# Patient Record
Sex: Female | Born: 1990 | Race: Black or African American | Hispanic: No | Marital: Married | Smoking: Never smoker
Health system: Southern US, Community
[De-identification: ages and names within clinical notes are randomized; demographics above are authoritative.]

## PROBLEM LIST (undated history)

## (undated) DIAGNOSIS — Z789 Other specified health status: Secondary | ICD-10-CM

## (undated) DIAGNOSIS — I10 Essential (primary) hypertension: Secondary | ICD-10-CM

## (undated) HISTORY — PX: APPENDECTOMY: SHX54

## (undated) HISTORY — DX: Essential (primary) hypertension: I10

---

## 2015-05-11 NOTE — L&D Delivery Note (Signed)
25 y/o G1P1 s/p Lap appy presented with SOL.  Delivery Note At  a viable female was delivered via  (Presentation: direct OP  ).  APGAR:8 ,9 ;  Placenta status: ntact with 3 vessels  Anesthesia:  Nitrous and IV fentyl Episiotomy:   none Lacerations:   none Est. Blood Loss (mL):   100  Mom to postpartum.  Baby to Couplet care / Skin to Skin.  Ernestina Pennaicholas Schenk 01/05/2016, 7:39 PM

## 2015-10-21 ENCOUNTER — Encounter: Payer: Self-pay | Admitting: *Deleted

## 2015-11-10 ENCOUNTER — Encounter: Payer: Self-pay | Admitting: Obstetrics & Gynecology

## 2015-11-10 ENCOUNTER — Other Ambulatory Visit (HOSPITAL_COMMUNITY)
Admission: RE | Admit: 2015-11-10 | Discharge: 2015-11-10 | Disposition: A | Discharge status: Home / Self Care | Payer: Medicaid Other | Source: Ambulatory Visit | Attending: Obstetrics & Gynecology | Admitting: Obstetrics & Gynecology

## 2015-11-10 ENCOUNTER — Ambulatory Visit (INDEPENDENT_AMBULATORY_CARE_PROVIDER_SITE_OTHER): Payer: Medicaid Other | Admitting: Obstetrics & Gynecology

## 2015-11-10 VITALS — BP 112/81 | HR 101 | Temp 98.2°F | Ht 61.5 in | Wt 158.8 lb

## 2015-11-10 DIAGNOSIS — Z01411 Encounter for gynecological examination (general) (routine) with abnormal findings: Secondary | ICD-10-CM | POA: Diagnosis present

## 2015-11-10 DIAGNOSIS — Z3400 Encounter for supervision of normal first pregnancy, unspecified trimester: Secondary | ICD-10-CM | POA: Insufficient documentation

## 2015-11-10 DIAGNOSIS — Z113 Encounter for screening for infections with a predominantly sexual mode of transmission: Secondary | ICD-10-CM | POA: Diagnosis present

## 2015-11-10 DIAGNOSIS — Z3490 Encounter for supervision of normal pregnancy, unspecified, unspecified trimester: Secondary | ICD-10-CM

## 2015-11-10 DIAGNOSIS — Z3403 Encounter for supervision of normal first pregnancy, third trimester: Secondary | ICD-10-CM

## 2015-11-10 DIAGNOSIS — Z3493 Encounter for supervision of normal pregnancy, unspecified, third trimester: Secondary | ICD-10-CM

## 2015-11-10 DIAGNOSIS — O0933 Supervision of pregnancy with insufficient antenatal care, third trimester: Secondary | ICD-10-CM | POA: Insufficient documentation

## 2015-11-10 LAB — POCT URINALYSIS DIP (DEVICE)
Bilirubin Urine: NEGATIVE
Glucose, UA: NEGATIVE mg/dL
HGB URINE DIPSTICK: NEGATIVE
Ketones, ur: NEGATIVE mg/dL
Nitrite: NEGATIVE
PH: 7 (ref 5.0–8.0)
Protein, ur: NEGATIVE mg/dL
SPECIFIC GRAVITY, URINE: 1.02 (ref 1.005–1.030)
UROBILINOGEN UA: 0.2 mg/dL (ref 0.0–1.0)

## 2015-11-10 MED ORDER — PANTOPRAZOLE SODIUM 40 MG PO TBEC: 40.0000 mg | DELAYED_RELEASE_TABLET | Freq: Every day | ORAL | Status: DC

## 2015-11-10 NOTE — Progress Notes (Signed)
Here for initial prenatal visit.  Transfer[place] care [facility]. Wants to wait on tdap.

## 2015-11-10 NOTE — Progress Notes (Signed)
  Subjective:    Kimberly Farley is a G1P0 5212w0d being seen today for her first obstetrical visit.  Her obstetrical history is significant for No prenatal care prior to 26 weeks, occasional alcohol use during pregnancy. Patient does intend to breast feed. Pregnancy history fully reviewed.  Patient reports heartburn, no bleeding, no contractions, no cramping and no leaking. No headaches, edema, changes in vision or RUQ pain since pregnancy began. Patient denies N/V/D and constipation throughout the pregnancy thus far.  [facility] at 26 weeks after taking a home pregnancy test requested by her mother. Mom is support person, FOB is minimally involved. Interested in IUD post-delivery. Maternal aunt has T2DM, mom has HTN. No personal history of either in the patient.   Dr. Tawni Levyorn's office provided anatomy scan and performed glucola test. Patient was instructed to do a second glucola test in this office by oversight. Patient acknowledged understanding.  Filed Vitals:   11/10/15 1455 11/10/15 1458 11/10/15 1505  BP: 113/86  112/81  Pulse: 101    Temp: 98.2 F (36.8 C)    Height:  5' 1.5" (1.562 m)   Weight: 158 lb 12.8 oz (72.031 kg)      HISTORY: OB History  Gravida Para Term Preterm AB SAB TAB Ectopic Multiple Living  1             # Outcome Date GA Lbr Len/2nd Weight Sex Delivery Anes PTL Lv  1 Current              History reviewed. No pertinent past medical history. History reviewed. No pertinent past surgical history. Family History  Problem Relation Age of Onset  . Hypertension Mother      Exam    Uterus: Appropriate for gestational age  Pelvic Exam:    Perineum: Normal external genitalia    Vulva: Normal external genitalia    Vagina:  Normal external genitalia    pH: N/A   Cervix: no bleeding following Pap, no cervical motion tenderness and no lesions   Adnexa: normal adnexa      System: Breast:  normal appearance, no masses or tenderness, No nipple  discharge or bleeding, No axillary or supraclavicular adenopathy   Skin: normal coloration and turgor, no rashes    Neurologic: AAOx3   Extremities: Full ROM, Strength 5/5                   Respiratory:  Normal WOB, no accessory muscle use   Abdomen: soft, non-tender; bowel sounds normal; no masses,  no organomegaly and gravid; appropriate for GA   Urinary: urethral meatus normal      Assessment:    Pregnancy: G1P0 Patient Active Problem List   Diagnosis Date Noted  . Supervision of normal first pregnancy 11/10/2015        Plan:    Supervision of normal first pregnancy Labs not provided by [facility] drawn. Pap smear, pelvic exam Encouraged patient to continue taking prenatal vitamins. Problem list reviewed and updated.  Instructed patient on how to cope with the growing pains of a fetus. Pre-term labor precautions provided  Follow up in 2 weeks. 50% of 30 min visit spent on counseling and coordination of care  Mariana SingleJamie Neira Bentsen 11/10/2015

## 2015-11-10 NOTE — Addendum Note (Signed)
Addended by: Adam PhenixARNOLD, JAMES G on: 11/10/2015 05:22 PM   Modules accepted: Orders

## 2015-11-10 NOTE — Patient Instructions (Signed)

## 2015-11-12 LAB — GC/CHLAMYDIA PROBE AMP (~~LOC~~) NOT AT ARMC
Chlamydia: NEGATIVE
NEISSERIA GONORRHEA: NEGATIVE

## 2015-11-12 LAB — CYTOLOGY - PAP

## 2015-11-19 ENCOUNTER — Ambulatory Visit (HOSPITAL_COMMUNITY)
Admission: RE | Admit: 2015-11-19 | Discharge: 2015-11-19 | Disposition: A | Discharge status: Home / Self Care | Payer: Medicaid Other | Source: Ambulatory Visit | Attending: Obstetrics & Gynecology | Admitting: Obstetrics & Gynecology

## 2015-11-19 ENCOUNTER — Other Ambulatory Visit: Payer: Self-pay | Admitting: Obstetrics & Gynecology

## 2015-11-19 DIAGNOSIS — O0933 Supervision of pregnancy with insufficient antenatal care, third trimester: Secondary | ICD-10-CM

## 2015-11-19 DIAGNOSIS — Z3490 Encounter for supervision of normal pregnancy, unspecified, unspecified trimester: Secondary | ICD-10-CM

## 2015-11-19 DIAGNOSIS — Z3689 Encounter for other specified antenatal screening: Secondary | ICD-10-CM

## 2015-11-19 DIAGNOSIS — Z3493 Encounter for supervision of normal pregnancy, unspecified, third trimester: Secondary | ICD-10-CM

## 2015-11-19 DIAGNOSIS — Z3A30 30 weeks gestation of pregnancy: Secondary | ICD-10-CM

## 2015-11-19 DIAGNOSIS — Z3403 Encounter for supervision of normal first pregnancy, third trimester: Secondary | ICD-10-CM

## 2015-11-19 DIAGNOSIS — Z36 Encounter for antenatal screening of mother: Secondary | ICD-10-CM | POA: Insufficient documentation

## 2015-11-20 ENCOUNTER — Other Ambulatory Visit (HOSPITAL_COMMUNITY): Payer: Self-pay | Admitting: *Deleted

## 2015-11-20 DIAGNOSIS — O283 Abnormal ultrasonic finding on antenatal screening of mother: Secondary | ICD-10-CM

## 2015-11-20 LAB — PARVOVIRUS B19 ANTIBODY, IGG AND IGM
Parovirus B19 IgG Abs: 6.3 index — ABNORMAL HIGH (ref 0.0–0.8)
Parovirus B19 IgM Abs: 0.2 index (ref 0.0–0.8)

## 2015-11-20 LAB — CMV IGM: CMV IgM: <30 AU/mL (ref 0.0–29.9)

## 2015-11-20 LAB — TOXOPLASMA ANTIBODIES- IGG AND  IGM: Toxoplasma Antibody- IgM: 3.2 AU/mL (ref 0.0–7.9)

## 2015-11-20 LAB — CMV ANTIBODY, IGG (EIA)

## 2015-11-21 ENCOUNTER — Telehealth (HOSPITAL_COMMUNITY): Payer: Self-pay | Admitting: *Deleted

## 2015-11-21 NOTE — Telephone Encounter (Signed)
TC to patient, name and DOB verified.  Pt notified of neg viral studies, waiting on Panorama results.  Pt voiced understanding, no questions.

## 2015-11-27 ENCOUNTER — Other Ambulatory Visit (HOSPITAL_COMMUNITY): Payer: Self-pay

## 2015-11-27 ENCOUNTER — Telehealth (HOSPITAL_COMMUNITY): Payer: Self-pay | Admitting: MS"

## 2015-11-27 NOTE — Telephone Encounter (Signed)
Called Smitty KnudsenCourtney Gaddy to discuss her prenatal cell free DNA test results.  Ms. Smitty KnudsenCourtney Gaddy had Panorama testing through [facility].  Testing was offered because of ultrasound findings.   The patient was identified by name and DOB.  We reviewed that these are within normal limits, showing a less than 1 in 10,000 risk for trisomies 21, 18 and 13, and monosomy X (Turner syndrome).  In addition, the risk for triploidy/vanishing twin and sex chromosome trisomies (47,XXX and 47,XXY) was also low risk.  We reviewed that this testing identifies > 99% of pregnancies with trisomy 2221, trisomy 1313, sex chromosome trisomies (47,XXX and 47,XXY), and triploidy. The detection rate for trisomy 18 is 96%.  The detection rate for monosomy X is ~92%.  The false positive rate is <0.1% for all conditions. Testing was also consistent with female fetal sex.  She understands that this testing does not identify all genetic conditions.  All questions were answered to her satisfaction, she was encouraged to call with additional questions or concerns.  Quinn PlowmanKaren Darrah Dredge, MS Certified Genetic Counselor 11/27/2015 9:20 AM

## 2015-12-01 ENCOUNTER — Ambulatory Visit (INDEPENDENT_AMBULATORY_CARE_PROVIDER_SITE_OTHER): Payer: Medicaid Other | Admitting: Advanced Practice Midwife

## 2015-12-01 VITALS — BP 119/75 | HR 79 | Wt 161.7 lb

## 2015-12-01 DIAGNOSIS — E319 Polyglandular dysfunction, unspecified: Secondary | ICD-10-CM

## 2015-12-01 DIAGNOSIS — Z3403 Encounter for supervision of normal first pregnancy, third trimester: Secondary | ICD-10-CM

## 2015-12-01 DIAGNOSIS — R319 Hematuria, unspecified: Secondary | ICD-10-CM

## 2015-12-01 LAB — POCT URINALYSIS DIP (DEVICE)
Bilirubin Urine: NEGATIVE
GLUCOSE, UA: NEGATIVE mg/dL
Ketones, ur: NEGATIVE mg/dL
LEUKOCYTES UA: NEGATIVE
NITRITE: NEGATIVE
Protein, ur: NEGATIVE mg/dL
SPECIFIC GRAVITY, URINE: 1.02 (ref 1.005–1.030)
UROBILINOGEN UA: 0.2 mg/dL (ref 0.0–1.0)
pH: 7 (ref 5.0–8.0)

## 2015-12-01 NOTE — Progress Notes (Signed)
Urinalysis shows moderate hemoglobin. Denies burning or pain with urination.

## 2015-12-01 NOTE — Progress Notes (Signed)
Subjective:  Kimberly Farley is a 25 y.o. G1P0 at [redacted]w[redacted]d being seen today for ongoing prenatal care.  She is currently monitored for the following issues for this low-risk pregnancy and has Supervision of normal first pregnancy and Late prenatal care affecting pregnancy in third trimester, antepartum on her problem list.  Patient reports no complaints.  Contractions: Irregular. Vag. Bleeding: None.  Movement: Present. Denies leaking of fluid.   The following portions of the patient's history were reviewed and updated as appropriate: allergies, current medications, past family history, past medical history, past social history, past surgical history and problem list. Problem list updated.  Objective:   Vitals:   12/01/15 1446  BP: 119/75  Pulse: 79  Weight: 161 lb 11.2 oz (73.3 kg)    Fetal Status: Fetal Heart Rate (bpm): 156 Fundal Height: 32 cm Movement: Present     General:  Alert, oriented and cooperative. Patient is in no acute distress.  Skin: Skin is warm and dry. No rash noted.   Cardiovascular: Normal heart rate noted  Respiratory: Normal respiratory effort, no problems with respiration noted  Abdomen: Soft, gravid, appropriate for gestational age. Pain/Pressure: Absent     Pelvic:  Cervical exam deferred        Extremities: Normal range of motion.  Edema: Trace  Mental Status: Normal mood and affect. Normal behavior. Normal judgment and thought content.   Urinalysis: Urine Protein: Negative Urine Glucose: Negative  Assessment and Plan:  Pregnancy: G1P0 at [redacted]w[redacted]d  1. Hematuria  - Culture, OB Urine  2. Encounter for supervision of normal first pregnancy in third trimester - Plans TDaP at [place]. Handout given.    Preterm labor symptoms and general obstetric precautions including but not limited to vaginal bleeding, contractions, leaking of fluid and fetal movement were reviewed in detail with the patient. Please refer to After Visit Summary for other counseling  recommendations.  Return in about 2 weeks (around 12/15/2015) for ROB.   Dorathy Kinsman, CNM

## 2015-12-01 NOTE — Patient Instructions (Addendum)
Braxton Hicks Contractions Contractions of the uterus can occur throughout pregnancy. Contractions are not always a sign that you are in labor.  WHAT ARE BRAXTON HICKS CONTRACTIONS?  Contractions that occur before labor are called Braxton Hicks contractions, or false labor. Toward the end of pregnancy (32-34 weeks), these contractions can develop more often and may become more forceful. This is not true labor because these contractions do not result in opening (dilatation) and thinning of the cervix. They are sometimes difficult to tell apart from true labor because these contractions can be forceful and people have different pain tolerances. You should not feel embarrassed if you go to the hospital with false labor. Sometimes, the only way to tell if you are in true labor is for your health care provider to look for changes in the cervix. If there are no prenatal problems or other health problems associated with the pregnancy, it is completely safe to be sent home with false labor and await the onset of true labor. HOW CAN YOU TELL THE DIFFERENCE BETWEEN TRUE AND FALSE LABOR? False Labor  The contractions of false labor are usually shorter and not as hard as those of true labor.   The contractions are usually irregular.   The contractions are often felt in the front of the lower abdomen and in the groin.   The contractions may go away when you walk around or change positions while lying down.   The contractions get weaker and are shorter lasting as time goes on.   The contractions do not usually become progressively stronger, regular, and closer together as with true labor.  True Labor  Contractions in true labor last 30-70 seconds, become very regular, usually become more intense, and increase in frequency.   The contractions do not go away with walking.   The discomfort is usually felt in the top of the uterus and spreads to the lower abdomen and low back.   True labor can be  determined by your health care provider with an exam. This will show that the cervix is dilating and getting thinner.  WHAT TO REMEMBER  Keep up with your usual exercises and follow other instructions given by your health care provider.   Take medicines as directed by your health care provider.   Keep your regular prenatal appointments.   Eat and drink lightly if you think you are going into labor.   If Braxton Hicks contractions are making you uncomfortable:   Change your position from lying down or resting to walking, or from walking to resting.   Sit and rest in a tub of warm water.   Drink 2-3 glasses of water. Dehydration may cause these contractions.   Do slow and deep breathing several times an hour.  WHEN SHOULD I SEEK IMMEDIATE MEDICAL CARE? Seek immediate medical care if:  Your contractions become stronger, more regular, and closer together.   You have fluid leaking or gushing from your vagina.   You have a fever.   You pass blood-tinged mucus.   You have vaginal bleeding.   You have continuous abdominal pain.   You have low back pain that you never had before.   You feel your baby's head pushing down and causing pelvic pressure.   Your baby is not moving as much as it used to.    This information is not intended to replace advice given to you by your health care provider. Make sure you discuss any questions you have with your health care   provider.   Document Released: 04/26/2005 Document Revised: 05/01/2013 Document Reviewed: 02/05/2013 Elsevier Interactive Patient Education 2016 Elsevier Inc.  Tdap Vaccine (Tetanus, Diphtheria and Pertussis): What You Need to Know 1. Why get vaccinated? Tetanus, diphtheria and pertussis are very serious diseases. Tdap vaccine can protect us from these diseases. And, Tdap vaccine given to pregnant women can protect newborn babies against pertussis. TETANUS (Lockjaw) is rare in the [place] today. It  causes painful muscle tightening and stiffness, usually all over the body.  It can lead to tightening of muscles in the head and neck so you can't open your mouth, swallow, or sometimes even breathe. Tetanus kills about 1 out of 10 people who are infected even after receiving the best medical care. DIPHTHERIA is also rare in the [place] today. It can cause a thick coating to form in the back of the throat.  It can lead to breathing problems, heart failure, paralysis, and death. PERTUSSIS (Whooping Cough) causes severe coughing spells, which can cause difficulty breathing, vomiting and disturbed sleep.  It can also lead to weight loss, incontinence, and rib fractures. Up to 2 in 100 adolescents and 5 in 100 adults with pertussis are hospitalized or have complications, which could include pneumonia or death. These diseases are caused by bacteria. Diphtheria and pertussis are spread from person to person through secretions from coughing or sneezing. Tetanus enters the body through cuts, scratches, or wounds. Before vaccines, as many as 200,000 cases of diphtheria, 200,000 cases of pertussis, and hundreds of cases of tetanus, were reported in the [place] each year. Since vaccination began, reports of cases for tetanus and diphtheria have dropped by about 99% and for pertussis by about 80%. 2. Tdap vaccine Tdap vaccine can protect adolescents and adults from tetanus, diphtheria, and pertussis. One dose of Tdap is routinely given at age 11 or 12. People who did not get Tdap at that age should get it as soon as possible. Tdap is especially important for healthcare professionals and anyone having close contact with a baby younger than 12 months. Pregnant women should get a dose of Tdap during every pregnancy, to protect the newborn from pertussis. Infants are most at risk for severe, life-threatening complications from pertussis. Another vaccine, called Td, protects against tetanus and diphtheria,  but not pertussis. A Td booster should be given every 10 years. Tdap may be given as one of these boosters if you have never gotten Tdap before. Tdap may also be given after a severe cut or burn to prevent tetanus infection. Your doctor or the person giving you the vaccine can give you more information. Tdap may safely be given at the same time as other vaccines. 3. Some people should not get this vaccine  A person who has ever had a life-threatening allergic reaction after a previous dose of any diphtheria, tetanus or pertussis containing vaccine, OR has a severe allergy to any part of this vaccine, should not get Tdap vaccine. Tell the person giving the vaccine about any severe allergies.  Anyone who had coma or long repeated seizures within 7 days after a childhood dose of DTP or DTaP, or a previous dose of Tdap, should not get Tdap, unless a cause other than the vaccine was found. They can still get Td.  Talk to your doctor if you:  have seizures or another nervous system problem,  had severe pain or swelling after any vaccine containing diphtheria, tetanus or pertussis,  ever had a condition called Guillain-Barr Syndrome (  GBS),  aren't feeling well on the day the shot is scheduled. 4. Risks With any medicine, including vaccines, there is a chance of side effects. These are usually mild and go away on their own. Serious reactions are also possible but are rare. Most people who get Tdap vaccine do not have any problems with it. Mild problems following Tdap (Did not interfere with activities)  Pain where the shot was given (about 3 in 4 adolescents or 2 in 3 adults)  Redness or swelling where the shot was given (about 1 person in 5)  Mild fever of at least 100.4F (up to about 1 in 25 adolescents or 1 in 100 adults)  Headache (about 3 or 4 people in 10)  Tiredness (about 1 person in 3 or 4)  Nausea, vomiting, diarrhea, stomach ache (up to 1 in 4 adolescents or 1 in 10  adults)  Chills, sore joints (about 1 person in 10)  Body aches (about 1 person in 3 or 4)  Rash, swollen glands (uncommon) Moderate problems following Tdap (Interfered with activities, but did not require medical attention)  Pain where the shot was given (up to 1 in 5 or 6)  Redness or swelling where the shot was given (up to about 1 in 16 adolescents or 1 in 12 adults)  Fever over 102F (about 1 in 100 adolescents or 1 in 250 adults)  Headache (about 1 in 7 adolescents or 1 in 10 adults)  Nausea, vomiting, diarrhea, stomach ache (up to 1 or 3 people in 100)  Swelling of the entire arm where the shot was given (up to about 1 in 500). Severe problems following Tdap (Unable to perform usual activities; required medical attention)  Swelling, severe pain, bleeding and redness in the arm where the shot was given (rare). Problems that could happen after any vaccine:  People sometimes faint after a medical procedure, including vaccination. Sitting or lying down for about 15 minutes can help prevent fainting, and injuries caused by a fall. Tell your doctor if you feel dizzy, or have vision changes or ringing in the ears.  Some people get severe pain in the shoulder and have difficulty moving the arm where a shot was given. This happens very rarely.  Any medication can cause a severe allergic reaction. Such reactions from a vaccine are very rare, estimated at fewer than 1 in a million doses, and would happen within a few minutes to a few hours after the vaccination. As with any medicine, there is a very remote chance of a vaccine causing a serious injury or death. The safety of vaccines is always being monitored. For more information, visit: www.cdc.gov/vaccinesafety/ 5. What if there is a serious problem? What should I look for?  Look for anything that concerns you, such as signs of a severe allergic reaction, very high fever, or unusual behavior.  Signs of a severe allergic reaction  can include hives, swelling of the face and throat, difficulty breathing, a fast heartbeat, dizziness, and weakness. These would usually start a few minutes to a few hours after the vaccination. What should I do?  If you think it is a severe allergic reaction or other emergency that can't wait, call 9-1-1 or get the person to the nearest hospital. Otherwise, call your doctor.  Afterward, the reaction should be reported to the Vaccine Adverse Event Reporting System (VAERS). Your doctor might file this report, or you can do it yourself through the VAERS web site at www.vaers.hhs.gov, or by calling   1-800-822-7967. VAERS does not give medical advice.  6. The National Vaccine Injury Compensation Program The National Vaccine Injury Compensation Program (VICP) is a federal program that was created to compensate people who may have been injured by certain vaccines. Persons who believe they may have been injured by a vaccine can learn about the program and about filing a claim by calling 1-800-338-2382 or visiting the VICP website at www.hrsa.gov/vaccinecompensation. There is a time limit to file a claim for compensation. 7. How can I learn more?  Ask your doctor. He or she can give you the vaccine package insert or suggest other sources of information.  Call your local or state health department.  Contact the Centers for Disease Control and Prevention (CDC):  Call 1-800-232-4636 (1-800-CDC-INFO) or  Visit CDC's website at www.cdc.gov/vaccines CDC Tdap Vaccine VIS (07/03/13)   This information is not intended to replace advice given to you by your health care provider. Make sure you discuss any questions you have with your health care provider.   Document Released: 10/26/2011 Document Revised: 05/17/2014 Document Reviewed: 08/08/2013 Elsevier Interactive Patient Education 2016 Elsevier Inc.   

## 2015-12-03 LAB — CULTURE, OB URINE

## 2015-12-11 ENCOUNTER — Ambulatory Visit (HOSPITAL_COMMUNITY)
Admission: RE | Admit: 2015-12-11 | Discharge: 2015-12-11 | Disposition: A | Discharge status: Home / Self Care | Payer: Medicaid Other | Source: Ambulatory Visit | Attending: Obstetrics & Gynecology | Admitting: Obstetrics & Gynecology

## 2015-12-11 ENCOUNTER — Encounter (HOSPITAL_COMMUNITY): Payer: Self-pay

## 2015-12-11 DIAGNOSIS — O0933 Supervision of pregnancy with insufficient antenatal care, third trimester: Secondary | ICD-10-CM | POA: Diagnosis not present

## 2015-12-11 DIAGNOSIS — O350XX0 Maternal care for (suspected) central nervous system malformation in fetus, not applicable or unspecified: Secondary | ICD-10-CM | POA: Insufficient documentation

## 2015-12-11 DIAGNOSIS — Z3A33 33 weeks gestation of pregnancy: Secondary | ICD-10-CM | POA: Diagnosis not present

## 2015-12-11 DIAGNOSIS — O283 Abnormal ultrasonic finding on antenatal screening of mother: Secondary | ICD-10-CM

## 2015-12-24 ENCOUNTER — Ambulatory Visit (INDEPENDENT_AMBULATORY_CARE_PROVIDER_SITE_OTHER): Payer: Medicaid Other | Admitting: Obstetrics and Gynecology

## 2015-12-24 ENCOUNTER — Other Ambulatory Visit (HOSPITAL_COMMUNITY)
Admission: RE | Admit: 2015-12-24 | Discharge: 2015-12-24 | Disposition: A | Discharge status: Home / Self Care | Payer: Medicaid Other | Source: Ambulatory Visit | Attending: Obstetrics and Gynecology | Admitting: Obstetrics and Gynecology

## 2015-12-24 VITALS — BP 123/72 | HR 118

## 2015-12-24 DIAGNOSIS — Z113 Encounter for screening for infections with a predominantly sexual mode of transmission: Secondary | ICD-10-CM | POA: Diagnosis not present

## 2015-12-24 DIAGNOSIS — Z3403 Encounter for supervision of normal first pregnancy, third trimester: Secondary | ICD-10-CM

## 2015-12-24 DIAGNOSIS — Z23 Encounter for immunization: Secondary | ICD-10-CM | POA: Diagnosis not present

## 2015-12-24 DIAGNOSIS — O0933 Supervision of pregnancy with insufficient antenatal care, third trimester: Secondary | ICD-10-CM

## 2015-12-24 LAB — OB RESULTS CONSOLE GC/CHLAMYDIA
CHLAMYDIA, DNA PROBE: NEGATIVE
Gonorrhea: NEGATIVE

## 2015-12-24 LAB — OB RESULTS CONSOLE GBS: GBS: NEGATIVE

## 2015-12-24 MED ORDER — TETANUS-DIPHTH-ACELL PERTUSSIS 5-2.5-18.5 LF-MCG/0.5 IM SUSP
0.5000 mL | Freq: Once | INTRAMUSCULAR | Status: AC
  Administered 2015-12-24: 0.5 mL via INTRAMUSCULAR

## 2015-12-24 NOTE — Progress Notes (Signed)
Subjective:  Smitty KnudsenCourtney Gaddy is a 25 y.o. G1P0 at 1748w2d being seen today for ongoing prenatal care.  She is currently monitored for the following issues for this low-risk pregnancy and has Supervision of normal first pregnancy and Late prenatal care affecting pregnancy in third trimester, antepartum on her problem list.  Patient reports fatigue.  Contractions: Not present.  .  Movement: Present. Denies leaking of fluid.   The following portions of the patient's history were reviewed and updated as appropriate: allergies, current medications, past family history, past medical history, past social history, past surgical history and problem list. Problem list updated.  Objective:   Vitals:   12/24/15 1305  BP: 123/72  Pulse: (!) 118    Fetal Status: Fetal Heart Rate (bpm): 133 Fundal Height: 36 cm Movement: Present     General:  Alert, oriented and cooperative. Patient is in no acute distress.  Skin: Skin is warm and dry. No rash noted.   Cardiovascular: Normal heart rate noted  Respiratory: Normal respiratory effort, no problems with respiration noted  Abdomen: Soft, gravid, appropriate for gestational age. Pain/Pressure: Absent     Pelvic:  Cervical exam deferred        Extremities: Normal range of motion.  Edema: Trace  Mental Status: Normal mood and affect. Normal behavior. Normal judgment and thought content.   Urinalysis:      Assessment and Plan:  Pregnancy: G1P0 at 8548w2d  1. Encounter for supervision of normal first pregnancy in third trimester - Culture, beta strep (group b only) - GC/Chlamydia probe amp ([place] -Tdap vaccine today  2. Late prenatal care affecting pregnancy in third trimester, antepartum 3. Contraceptive information on IUD provided to pt.   Preterm labor symptoms and general obstetric precautions including but not limited to vaginal bleeding, contractions, leaking of fluid and fetal movement were reviewed in detail with the  patient. Please refer to After Visit Summary for other counseling recommendations.  Return in about 1 week (around 12/31/2015) for OB visit.   Hermina StaggersMichael L Samil Mecham, MD

## 2015-12-25 LAB — GC/CHLAMYDIA PROBE AMP (~~LOC~~) NOT AT ARMC
Chlamydia: NEGATIVE
NEISSERIA GONORRHEA: NEGATIVE

## 2015-12-26 LAB — CULTURE, BETA STREP (GROUP B ONLY)

## 2015-12-30 ENCOUNTER — Inpatient Hospital Stay (HOSPITAL_COMMUNITY): Payer: Medicaid Other

## 2015-12-30 ENCOUNTER — Inpatient Hospital Stay (HOSPITAL_COMMUNITY)
Admission: AD | Admit: 2015-12-30 | Discharge: 2016-01-04 | DRG: 781 | Disposition: A | Discharge status: Home / Self Care | Payer: Medicaid Other | Source: Ambulatory Visit | Attending: Obstetrics and Gynecology | Admitting: Obstetrics and Gynecology

## 2015-12-30 ENCOUNTER — Encounter (HOSPITAL_COMMUNITY): Payer: Self-pay | Admitting: *Deleted

## 2015-12-30 DIAGNOSIS — K37 Unspecified appendicitis: Secondary | ICD-10-CM | POA: Diagnosis present

## 2015-12-30 DIAGNOSIS — Z3A36 36 weeks gestation of pregnancy: Secondary | ICD-10-CM

## 2015-12-30 DIAGNOSIS — O0990 Supervision of high risk pregnancy, unspecified, unspecified trimester: Secondary | ICD-10-CM

## 2015-12-30 DIAGNOSIS — R52 Pain, unspecified: Secondary | ICD-10-CM

## 2015-12-30 DIAGNOSIS — O99613 Diseases of the digestive system complicating pregnancy, third trimester: Principal | ICD-10-CM | POA: Diagnosis present

## 2015-12-30 DIAGNOSIS — Z8249 Family history of ischemic heart disease and other diseases of the circulatory system: Secondary | ICD-10-CM

## 2015-12-30 DIAGNOSIS — Z823 Family history of stroke: Secondary | ICD-10-CM

## 2015-12-30 DIAGNOSIS — K352 Acute appendicitis with generalized peritonitis: Secondary | ICD-10-CM | POA: Diagnosis present

## 2015-12-30 DIAGNOSIS — O0933 Supervision of pregnancy with insufficient antenatal care, third trimester: Secondary | ICD-10-CM

## 2015-12-30 DIAGNOSIS — K3580 Unspecified acute appendicitis: Secondary | ICD-10-CM

## 2015-12-30 DIAGNOSIS — O99619 Diseases of the digestive system complicating pregnancy, unspecified trimester: Secondary | ICD-10-CM

## 2015-12-30 DIAGNOSIS — K219 Gastro-esophageal reflux disease without esophagitis: Secondary | ICD-10-CM | POA: Diagnosis present

## 2015-12-30 HISTORY — DX: Other specified health status: Z78.9

## 2015-12-30 LAB — CBC WITH DIFFERENTIAL/PLATELET
BASOS ABS: 0 10*3/uL (ref 0.0–0.1)
Basophils Relative: 0 %
EOS ABS: 0 10*3/uL (ref 0.0–0.7)
Eosinophils Relative: 0 %
HEMATOCRIT: 37.2 % (ref 36.0–46.0)
HEMOGLOBIN: 13.3 g/dL (ref 12.0–15.0)
LYMPHS PCT: 4 %
Lymphs Abs: 0.7 10*3/uL (ref 0.7–4.0)
MCH: 31.2 pg (ref 26.0–34.0)
MCHC: 35.8 g/dL (ref 30.0–36.0)
MCV: 87.3 fL (ref 78.0–100.0)
MONOS PCT: 6 %
Monocytes Absolute: 1.1 10*3/uL — ABNORMAL HIGH (ref 0.1–1.0)
NEUTROS ABS: 15.9 10*3/uL — AB (ref 1.7–7.7)
NEUTROS PCT: 90 %
Platelets: 283 10*3/uL (ref 150–400)
RBC: 4.26 MIL/uL (ref 3.87–5.11)
RDW: 13 % (ref 11.5–15.5)
WBC: 17.7 10*3/uL — ABNORMAL HIGH (ref 4.0–10.5)

## 2015-12-30 LAB — COMPREHENSIVE METABOLIC PANEL
ALK PHOS: 116 U/L (ref 38–126)
ALT: 16 U/L (ref 14–54)
ANION GAP: 13 (ref 5–15)
AST: 20 U/L (ref 15–41)
Albumin: 2.9 g/dL — ABNORMAL LOW (ref 3.5–5.0)
BILIRUBIN TOTAL: 1.1 mg/dL (ref 0.3–1.2)
BUN: 6 mg/dL (ref 6–20)
CALCIUM: 9.8 mg/dL (ref 8.9–10.3)
CO2: 16 mmol/L — AB (ref 22–32)
Chloride: 105 mmol/L (ref 101–111)
Creatinine, Ser: 0.81 mg/dL (ref 0.44–1.00)
GFR calc non Af Amer: >60 mL/min (ref >60)
GLUCOSE: 103 mg/dL — AB (ref 65–99)
POTASSIUM: 3.5 mmol/L (ref 3.5–5.1)
Sodium: 134 mmol/L — ABNORMAL LOW (ref 135–145)
TOTAL PROTEIN: 7.5 g/dL (ref 6.5–8.1)

## 2015-12-30 LAB — URINALYSIS, ROUTINE W REFLEX MICROSCOPIC
Glucose, UA: NEGATIVE mg/dL
Ketones, ur: >80 mg/dL — AB
Leukocytes, UA: NEGATIVE
NITRITE: NEGATIVE
PROTEIN: 100 mg/dL — AB
Specific Gravity, Urine: >1.03 — ABNORMAL HIGH (ref 1.005–1.030)
pH: 6 (ref 5.0–8.0)

## 2015-12-30 LAB — CBC
HEMATOCRIT: 32.7 % — AB (ref 36.0–46.0)
HEMOGLOBIN: 11.8 g/dL — AB (ref 12.0–15.0)
MCH: 31.5 pg (ref 26.0–34.0)
MCHC: 36.1 g/dL — AB (ref 30.0–36.0)
MCV: 87.2 fL (ref 78.0–100.0)
Platelets: 354 10*3/uL (ref 150–400)
RBC: 3.75 MIL/uL — ABNORMAL LOW (ref 3.87–5.11)
RDW: 13.2 % (ref 11.5–15.5)
WBC: 18.5 10*3/uL — ABNORMAL HIGH (ref 4.0–10.5)

## 2015-12-30 LAB — URINE MICROSCOPIC-ADD ON: RBC / HPF: NONE SEEN RBC/hpf (ref 0–5)

## 2015-12-30 LAB — OB RESULTS CONSOLE HEPATITIS B SURFACE ANTIGEN: Hepatitis B Surface Ag: NEGATIVE

## 2015-12-30 LAB — LIPASE, BLOOD: LIPASE: 57 U/L — AB (ref 11–51)

## 2015-12-30 LAB — OB RESULTS CONSOLE HIV ANTIBODY (ROUTINE TESTING): HIV: NONREACTIVE

## 2015-12-30 LAB — OB RESULTS CONSOLE RUBELLA ANTIBODY, IGM: RUBELLA: IMMUNE

## 2015-12-30 MED ORDER — ONDANSETRON HCL 4 MG PO TABS
4.0000 mg | ORAL_TABLET | Freq: Once | ORAL | Status: AC
  Administered 2015-12-30: 4 mg via ORAL
  Filled 2015-12-30: qty 1

## 2015-12-30 MED ORDER — MORPHINE SULFATE (PF) 4 MG/ML IV SOLN
4.0000 mg | Freq: Once | INTRAVENOUS | Status: AC
  Administered 2015-12-30: 4 mg via INTRAVENOUS
  Filled 2015-12-30: qty 1

## 2015-12-30 MED ORDER — SODIUM CHLORIDE 0.9 % IV SOLN
INTRAVENOUS | Status: DC
  Administered 2015-12-31: via INTRAVENOUS

## 2015-12-30 MED ORDER — LACTATED RINGERS IV BOLUS (SEPSIS)
1000.0000 mL | Freq: Once | INTRAVENOUS | Status: AC
  Administered 2015-12-30: 1000 mL via INTRAVENOUS

## 2015-12-30 MED ORDER — DEXTROSE 5 % IV SOLN
1.0000 g | Freq: Once | INTRAVENOUS | Status: AC
  Administered 2015-12-31: 1 g via INTRAVENOUS
  Filled 2015-12-30: qty 10

## 2015-12-30 MED ORDER — MORPHINE SULFATE (PF) 4 MG/ML IV SOLN
4.0000 mg | INTRAVENOUS | Status: DC | PRN
  Administered 2015-12-30: 4 mg via INTRAVENOUS
  Filled 2015-12-30: qty 1

## 2015-12-30 NOTE — ED Notes (Signed)
MD at bedside. 

## 2015-12-30 NOTE — ED Notes (Signed)
Bed: [id number] Expected date:  Expected time:  Means of arrival:  Comments: tx from [facility]

## 2015-12-30 NOTE — MAU Note (Signed)
Hurts to stand, can hardly walk

## 2015-12-30 NOTE — MAU Note (Signed)
Pain started yesterday,.  Is constant. No bleeding or leaking.  Denies constipation or diarrhea. No urinary symptoms.

## 2015-12-30 NOTE — Progress Notes (Signed)
Pt lying on her left side crying in pain and restless. Pt's abdomen tender to touch when RN adjusting monitors. Dr Artist PaisYoo on unit and requested to assess pt.

## 2015-12-30 NOTE — MAU Provider Note (Signed)
History     CSN: [id number]  Arrival date and time: 12/30/15 1557   First Provider Initiated Contact with Patient 12/30/15 1719      Chief Complaint  Patient presents with  . Abdominal Pain   Kimberly Farley is a 25yo G1P0 at 36.1 here with c/o nausea and vomiting x 2 days. States started feeling nauseous suddenly yesterday and  N/v x2 days. Vomited 5x on the first day then 2x yesterday, only food and fluid, no blood. Subjective fevers but no elevated temps at home. Started having abdominal pain yesterday. RLQ> LUQ pain 9/10 at worst, cramping, constant but waxing and waning. No sick contacts but recent started school at [employer]. Denies LOF, bleeding. Endorses good fetal movement.     OB History    Gravida Para Term Preterm AB Living   1             SAB TAB Ectopic Multiple Live Births                  Past Medical History:  Diagnosis Date  . Medical history non-contributory     Past Surgical History:  Procedure Laterality Date  . NO PAST SURGERIES      Family History  Problem Relation Age of Onset  . Hypertension Mother   . Hypertension Maternal Grandmother   . Stroke Maternal Grandmother   . Hearing loss Maternal Grandmother   . Alcohol abuse Maternal Grandfather     Social History  Substance Use Topics  . Smoking status: Never Smoker  . Smokeless tobacco: Never Used  . Alcohol use Yes     Comment: socially until pregnancy    Allergies: No Known Allergies  Prescriptions Prior to Admission  Medication Sig Dispense Refill Last Dose  . pantoprazole (PROTONIX) 40 MG tablet Take 1 tablet (40 mg total) by mouth daily. 30 tablet 2 Past Month at Unknown time  . Prenatal Vit-Fe Fumarate-FA (PRENATAL MULTIVITAMIN) TABS tablet Take 1 tablet by mouth daily at 12 noon.   12/30/2015 at Unknown time    Review of Systems  Constitutional: Positive for chills and fever.  Eyes: Negative for blurred vision, double vision and photophobia.  Respiratory: Negative for  shortness of breath.   Cardiovascular: Negative for chest pain.  Gastrointestinal: Positive for abdominal pain (RLQ), nausea and vomiting.  Genitourinary: Positive for flank pain (on R). Negative for dysuria and hematuria.   Physical Exam   Blood pressure 118/71, pulse 111, temperature 98.1 F (36.7 C), temperature source Oral, resp. rate 20, height 5' 1.5" (1.562 m), weight 77.1 kg (170 lb), SpO2 100 %.  Physical Exam  Constitutional: She is oriented to person, place, and time. She appears well-developed and well-nourished. She appears distressed.  HENT:  Head: Normocephalic and atraumatic.  Dry MMM  Eyes: EOM are normal.  Cardiovascular:  Tachycardic, S1 S2. No murmurs appreciated  Respiratory: Effort normal and breath sounds normal. No respiratory distress. She has no wheezes.  GI: Bowel sounds are normal. She exhibits no distension. There is tenderness (over RLQ). There is guarding. There is no rebound.  Neurological: She is alert and oriented to person, place, and time.  Skin: Skin is warm and dry. She is not diaphoretic.    Tachycardiac Lungs clear R CVA tenderness Diffuse TTP R>L, + guarding. No rebound   MAU Course  Procedures  MDM: Zofran, IV hydration and pain control with morphine UA not c/w UTI  CBC showing WBC 18.5  Assessment and Plan  Abdominal pain concerning  for appendicitis - CT vs MRI discussed with patient. [facility]. - pain control with morphine -case was discussed with Dr. Ethelda ChickJacubowitz who accepted pt for transfer.   Leland HerElsia J Yoo PGY-1 12/30/2015, 5:57 PM   OB FELLOW MAU ATTESTATION  I have seen and examined this patient; I agree with above documentation in the resident's note.    Ernestina Pennaicholas Schenk 12/30/2015, 8:52 PM

## 2015-12-30 NOTE — ED Notes (Signed)
MD made aware that pt needs pain meds.

## 2015-12-30 NOTE — ED Notes (Signed)
Patient transported to MRI 

## 2015-12-30 NOTE — ED Provider Notes (Addendum)
[facility]-EMERGENCY DEPT Provider Note   CSN: [id number] Arrival date & time: 12/30/15  1557     History   Chief Complaint Chief Complaint  Patient presents with  . Abdominal Pain    HPI Kimberly Farley is a 25 y.o. female.  HPIcomplains of abdominal pain at right lower quadrant, nonradiating onset 2 days ago, gradual. Pain worse with standing not improved with anything. Pain is nonradiating. No other associated symptoms. [facility]. Pain was not felt to be related to obstetric causes. Concern for appendicitis. Sent here for further ED evaluation and MRI to check for appendicitis. Other associated symptoms include diminished appetite. Last bowel movement yesterday, normal. No urinary symptoms.  Past Medical History:  Diagnosis Date  . Medical history non-contributory     Patient Active Problem List   Diagnosis Date Noted  . Supervision of normal first pregnancy 11/10/2015  . Late prenatal care affecting pregnancy in third trimester, antepartum 11/10/2015    Past Surgical History:  Procedure Laterality Date  . NO PAST SURGERIES      OB History    Gravida Para Term Preterm AB Living   1             SAB TAB Ectopic Multiple Live Births                   Home Medications    Prior to Admission medications   Medication Sig Start Date End Date Taking? Authorizing Provider  pantoprazole (PROTONIX) 40 MG tablet Take 1 tablet (40 mg total) by mouth daily. 11/10/15  Yes Adam PhenixJames G Arnold, MD  Prenatal Vit-Fe Fumarate-FA (PRENATAL MULTIVITAMIN) TABS tablet Take 1 tablet by mouth daily at 12 noon.   Yes Historical Provider, MD    Family History Family History  Problem Relation Age of Onset  . Hypertension Mother   . Hypertension Maternal Grandmother   . Stroke Maternal Grandmother   . Hearing loss Maternal Grandmother   . Alcohol abuse Maternal Grandfather     Social History Social History    Substance Use Topics  . Smoking status: Never Smoker  . Smokeless tobacco: Never Used  . Alcohol use Yes     Comment: socially until pregnancy     Allergies   Review of patient's allergies indicates no known allergies.   Review of Systems Review of Systems  Constitutional: Positive for appetite change.  Gastrointestinal: Positive for abdominal pain and nausea. Negative for vomiting.  Genitourinary:       Pregnant36 weeks  All other systems reviewed and are negative.    Physical Exam Updated Vital Signs BP 118/71 (BP Location: Right Arm)   Pulse 119   Temp 99.1 F (37.3 C) (Axillary)   Resp 18   Ht 5' 1.5" (1.562 m)   Wt 170 lb (77.1 kg)   SpO2 94%   BMI 31.60 kg/m   Physical Exam  Constitutional: She appears well-developed and well-nourished.  HENT:  Head: Normocephalic and atraumatic.  Eyes: Conjunctivae are normal. Pupils are equal, round, and reactive to light.  Neck: Neck supple. No tracheal deviation present. No thyromegaly present.  Cardiovascular: Normal rate and regular rhythm.   No murmur heard. Pulmonary/Chest: Effort normal and breath sounds normal.  Abdominal: Soft. Bowel sounds are normal. She exhibits no distension. There is no tenderness.  Gravid, tender right lower quadrant  Musculoskeletal: Normal range of motion. She exhibits no edema or tenderness.  Neurological: She is alert. Coordination  normal.  Skin: Skin is warm and dry. No rash noted.  Psychiatric: She has a normal mood and affect. Her behavior is normal.  Nursing note and vitals reviewed.  Fetal heart tones 148  ED Treatments / Results  Labs (all labs ordered are listed, but only abnormal results are displayed) Labs Reviewed  URINALYSIS, ROUTINE W REFLEX MICROSCOPIC (NOT AT [facility]) - Abnormal; Notable for the following:       Result Value   APPearance HAZY (*)    Specific Gravity, Urine >1.030 (*)    Hgb urine dipstick TRACE (*)    Bilirubin Urine SMALL (*)    Ketones, ur >80  (*)    Protein, ur 100 (*)    All other components within normal limits  URINE MICROSCOPIC-ADD ON - Abnormal; Notable for the following:    Squamous Epithelial / LPF 6-30 (*)    Bacteria, UA FEW (*)    Casts HYALINE CASTS (*)    All other components within normal limits  CBC - Abnormal; Notable for the following:    WBC 18.5 (*)    RBC 3.75 (*)    Hemoglobin 11.8 (*)    HCT 32.7 (*)    MCHC 36.1 (*)    All other components within normal limits    EKG  EKG Interpretation None       Radiology No results found.  Procedures Procedures (including critical care time)  Medications Ordered in ED Medications  morphine 4 MG/ML injection 4 mg (4 mg Intravenous Given 12/30/15 2043)  ondansetron (ZOFRAN) tablet 4 mg (4 mg Oral Given 12/30/15 1730)  morphine 4 MG/ML injection 4 mg (4 mg Intravenous Given 12/30/15 1917)  lactated ringers bolus 1,000 mL (1,000 mLs Intravenous Given 12/30/15 1915)     Initial Impression / Assessment and Plan / ED Course  I have reviewed the triage vital signs and the nursing notes.  Pertinent labs & imaging results that were available during my care of the patient were reviewed by me and considered in my medical decision making (see chart for details).  Clinical Course   11 15 p.m. Requesting pain medicine. IV morphine ordered. Dr. Ezzard Standing consulted. Will evaluate patient in ED for admission and surgery. IV Rocephin ordered after consultation with Dr. Ezzard Standing.  Results for orders placed or performed during the hospital encounter of 12/30/15  Urinalysis, Routine w reflex microscopic (not at [facility])  Result Value Ref Range   Color, Urine YELLOW YELLOW   APPearance HAZY (A) CLEAR   Specific Gravity, Urine >1.030 (H) 1.005 - 1.030   pH 6.0 5.0 - 8.0   Glucose, UA NEGATIVE NEGATIVE mg/dL   Hgb urine dipstick TRACE (A) NEGATIVE   Bilirubin Urine SMALL (A) NEGATIVE   Ketones, ur >80 (A) NEGATIVE mg/dL   Protein, ur 161 (A) NEGATIVE mg/dL   Nitrite  NEGATIVE NEGATIVE   Leukocytes, UA NEGATIVE NEGATIVE  Urine microscopic-add on  Result Value Ref Range   Squamous Epithelial / LPF 6-30 (A) NONE SEEN   WBC, UA 0-5 0 - 5 WBC/hpf   RBC / HPF NONE SEEN 0 - 5 RBC/hpf   Bacteria, UA FEW (A) NONE SEEN   Casts HYALINE CASTS (A) NEGATIVE  CBC  Result Value Ref Range   WBC 18.5 (H) 4.0 - 10.5 K/uL   RBC 3.75 (L) 3.87 - 5.11 MIL/uL   Hemoglobin 11.8 (L) 12.0 - 15.0 g/dL   HCT 09.6 (L) 04.5 - 40.9 %   MCV 87.2 78.0 - 100.0 fL  MCH 31.5 26.0 - 34.0 pg   MCHC 36.1 (H) 30.0 - 36.0 g/dL   RDW 19.1 47.8 - 29.5 %   Platelets 354 150 - 400 K/uL  Comprehensive metabolic panel  Result Value Ref Range   Sodium 134 (L) 135 - 145 mmol/L   Potassium 3.5 3.5 - 5.1 mmol/L   Chloride 105 101 - 111 mmol/L   CO2 16 (L) 22 - 32 mmol/L   Glucose, Bld 103 (H) 65 - 99 mg/dL   BUN 6 6 - 20 mg/dL   Creatinine, Ser 6.21 0.44 - 1.00 mg/dL   Calcium 9.8 8.9 - 30.8 mg/dL   Total Protein 7.5 6.5 - 8.1 g/dL   Albumin 2.9 (L) 3.5 - 5.0 g/dL   AST 20 15 - 41 U/L   ALT 16 14 - 54 U/L   Alkaline Phosphatase 116 38 - 126 U/L   Total Bilirubin 1.1 0.3 - 1.2 mg/dL   GFR calc non Af Amer >60 >60 mL/min   GFR calc Af Amer >60 >60 mL/min   Anion gap 13 5 - 15  CBC with Differential  Result Value Ref Range   WBC 17.7 (H) 4.0 - 10.5 K/uL   RBC 4.26 3.87 - 5.11 MIL/uL   Hemoglobin 13.3 12.0 - 15.0 g/dL   HCT 65.7 84.6 - 96.2 %   MCV 87.3 78.0 - 100.0 fL   MCH 31.2 26.0 - 34.0 pg   MCHC 35.8 30.0 - 36.0 g/dL   RDW 95.2 84.1 - 32.4 %   Platelets 283 150 - 400 K/uL   Neutrophils Relative % 90 %   Lymphocytes Relative 4 %   Monocytes Relative 6 %   Eosinophils Relative 0 %   Basophils Relative 0 %   Neutro Abs 15.9 (H) 1.7 - 7.7 K/uL   Lymphs Abs 0.7 0.7 - 4.0 K/uL   Monocytes Absolute 1.1 (H) 0.1 - 1.0 K/uL   Eosinophils Absolute 0.0 0.0 - 0.7 K/uL   Basophils Absolute 0.0 0.0 - 0.1 K/uL   Smear Review MORPHOLOGY UNREMARKABLE   Lipase, blood  Result Value Ref  Range   Lipase 57 (H) 11 - 51 U/L   Mr Pelvis Wo Contrast  Result Date: 12/30/2015 CLINICAL DATA:  Pregnant, nausea/vomiting, leukocytosis. EXAM: MRI ABDOMEN AND PELVIS WITHOUT CONTRAST TECHNIQUE: Multiplanar multisequence MR imaging of the abdomen and pelvis was performed. No intravenous contrast was administered. COMPARISON:  None. FINDINGS: Lower chest:  Lung bases are clear. Hepatobiliary: Liver is within normal limits. Gallbladder is unremarkable. No intrahepatic or extrahepatic ductal dilatation. Pancreas: Within normal limits. Spleen: Within normal limits. Adrenals/Urinary Tract: Adrenal glands are within normal limits. 13 mm cyst along the anterior interpolar left kidney (series 6/ image 5). Right kidney is within normal limits. Mild bilateral hydronephrosis, right greater than left. Bladder is within normal limits. Stomach/Bowel: Stomach is within normal limits. No evidence of bowel obstruction. Prominent appendix, measuring 12 mm (series 3/ image 22), although poorly visualized. Associated periappendiceal fluid in the right lower quadrant (series 4/image 19). Vascular/Lymphatic: No evidence of abdominal aortic aneurysm. No suspicious abdominopelvic lymphadenopathy. Reproductive: Gravid uterus. Placenta is anterior and free of the cervical os. Dedicated fetal evaluation was not performed.  Vertex position. Other: Trace fluid in the right lower quadrant. Musculoskeletal: No focal osseous lesions. IMPRESSION: Mildly prominent appendix, poorly visualized, but with associated periappendiceal fluid in the right lower quadrant. This appearance is considered worrisome for acute appendicitis. These results were called by telephone at the time of interpretation  on 12/30/2015 at 11:13 pm to [facility]Will Dansie, PA, who verbally acknowledged these results. Electronically Signed   By: Charline BillsSriyesh  Krishnan M.D.   On: 12/30/2015 23:14   Mr Abdomen Wo Contrast  Result Date: 12/30/2015 CLINICAL DATA:  Pregnant,  nausea/vomiting, leukocytosis. EXAM: MRI ABDOMEN AND PELVIS WITHOUT CONTRAST TECHNIQUE: Multiplanar multisequence MR imaging of the abdomen and pelvis was performed. No intravenous contrast was administered. COMPARISON:  None. FINDINGS: Lower chest:  Lung bases are clear. Hepatobiliary: Liver is within normal limits. Gallbladder is unremarkable. No intrahepatic or extrahepatic ductal dilatation. Pancreas: Within normal limits. Spleen: Within normal limits. Adrenals/Urinary Tract: Adrenal glands are within normal limits. 13 mm cyst along the anterior interpolar left kidney (series 6/ image 5). Right kidney is within normal limits. Mild bilateral hydronephrosis, right greater than left. Bladder is within normal limits. Stomach/Bowel: Stomach is within normal limits. No evidence of bowel obstruction. Prominent appendix, measuring 12 mm (series 3/ image 22), although poorly visualized. Associated periappendiceal fluid in the right lower quadrant (series 4/image 19). Vascular/Lymphatic: No evidence of abdominal aortic aneurysm. No suspicious abdominopelvic lymphadenopathy. Reproductive: Gravid uterus. Placenta is anterior and free of the cervical os. Dedicated fetal evaluation was not performed.  Vertex position. Other: Trace fluid in the right lower quadrant. Musculoskeletal: No focal osseous lesions. IMPRESSION: Mildly prominent appendix, poorly visualized, but with associated periappendiceal fluid in the right lower quadrant. This appearance is considered worrisome for acute appendicitis. These results were called by telephone at the time of interpretation on 12/30/2015 at 11:13 pm to [facility], PA, who verbally acknowledged these results. Electronically Signed   By: Charline BillsSriyesh  Krishnan M.D.   On: 12/30/2015 23:14   [place] Ob Follow Up  Result Date: 12/11/2015 OBSTETRICAL ULTRASOUND: This exam was performed within a [place] Ultrasound Department. The OB US report was generated in the AS system, and faxed to the  ordering physician.  This report is available in the [employer] PACS. See the AS Obstetric US report via the Image Link.  Final Clinical Impressions(s) / ED Diagnoses  Diagnosis acute appendicitis Final diagnoses:  Pain    New Prescriptions New Prescriptions   No medications on file     Doug SouSam Missy Baksh, MD 12/30/15 2329    Doug SouSam Danialle Dement, MD 12/30/15 2330

## 2015-12-30 NOTE — H&P (Signed)
Re:   Kimberly Farley DOB:   1991/02/12 MRN:   [id number]   [facility] admission  ASSESSMENT AND PLAN: 1.  Appendicitis  I discussed with the patient the indications and risks of appendiceal surgery.  The primary risks of appendiceal surgery include, but are not limited to, bleeding, infection, bowel surgery, and open surgery.  There is also a risk to the baby, but there is a significant risk for doing nothing. We discussed the typical post-operative recovery course. I tried to answer the patient's questions.  I discussed the possibility of inducing labor with the infection, surgery, or both.  The risk of open surgery is >50%, depending on the anatomy.  I have spoken to Dr. Tinnie Gensanya Pratt [phone]) who is the on call attending.  We [facility].  2.  Pregnant  [redacted] weeks - due date January 14, 2016  Chief Complaint  Patient presents with  . Abdominal Pain   REFERRING PHYSICIAN:  Dr. Alden ServerS. Jacubowitz  HISTORY OF PRESENT ILLNESS: Kimberly Farley is a 25 y.o. (DOB: 1991/02/12)  AA female whose primary care physician is No primary care provider on file. [facility].  Her "abdominal cramps" began last PM.  She vomited about 5 times.  She has not had vomiting during this pregnancy.  She tried to sleep, but hurt worse this AM. [facility], she was seen by Dr. Dorris CarnesN. Schenk , but he suspected appendicitis.  [facility]  MRI - 12/30/2015 - Mildly prominent appendix, poorly visualized, but with associated periappendiceal fluid in the right lower quadrant. This appearance is considered worrisome for acute appendicitis. WBC - 17,700 - 12/30/2015  She has no history of stomach disease, liver disease, gall bladder disease,  pancreas disease, or colon disease.  She has had no prior abdominal surgery.  I discussed surgical plans with Dr. Tinnie Gensanya Pratt [phone]) and Dr. Jonnie FinnerJennifer  Allen.   Past Medical History:  Diagnosis Date  . Medical history non-contributory       Past Surgical History:  Procedure Laterality Date  . NO PAST SURGERIES        Current Facility-Administered Medications  Medication Dose Route Frequency Provider Last Rate Last Dose  . 0.9 %  sodium chloride infusion   Intravenous Continuous Doug SouSam Jacubowitz, MD      . cefTRIAXone (ROCEPHIN) 1 g in dextrose 5 % 50 mL IVPB  1 g Intravenous Once Doug SouSam Jacubowitz, MD      . morphine 4 MG/ML injection 4 mg  4 mg Intravenous Q2H PRN Leland HerElsia J Yoo, DO   4 mg at 12/30/15 2043   Current Outpatient Prescriptions  Medication Sig Dispense Refill  . pantoprazole (PROTONIX) 40 MG tablet Take 1 tablet (40 mg total) by mouth daily. 30 tablet 2  . Prenatal Vit-Fe Fumarate-FA (PRENATAL MULTIVITAMIN) TABS tablet Take 1 tablet by mouth daily at 12 noon.       No Known Allergies  REVIEW OF SYSTEMS: Skin:  No history of rash.  No history of abnormal moles. Infection:  No history of hepatitis or HIV.  No history of MRSA. Neurologic:  No history of stroke.  No history of seizure.   Cardiac:  No history of hypertension. No history of heart disease.   Pulmonary:  Does not smoke cigarettes.  No asthma or bronchitis.   Endocrine:  No diabetes. No thyroid disease. Gastrointestinal:  See HPI.  Urologic:  No history of kidney stones.  No history of bladder infections. Musculoskeletal:  No history of joint or back disease. Hematologic:  No bleeding disorder.  No history of anemia.  Not anticoagulated. Psycho-social:  The patient is oriented.   The patient has no obvious psychologic or social impairment to understanding our conversation and plan.  SOCIAL and FAMILY HISTORY: Single.  Mother, Ilda MoriSophia Farley, and friend, Kimberly PulsDwan Farley, in room with patient. Father of baby lives 3 hours away and not involved. She is a Consulting civil engineerstudent at [employer] [place]  PHYSICAL EXAM: BP 121/64 (BP Location: Left  Arm)   Pulse 116   Temp 98.5 F (36.9 C) (Oral)   Resp 18   Ht 5' 1.5" (1.562 m)   Wt 77.1 kg (170 lb)   SpO2 92%   BMI 31.60 kg/m   General: AA who is alert and generally healthy appearing.  HEENT: Normal. Pupils equal. Neck: Supple. No mass.  No thyroid mass. Lymph Nodes:  No supraclavicular or cervical nodes. Lungs: Clear to auscultation and symmetric breath sounds. Heart:  RRR. No murmur or rub. Abdomen:  No abdominal scars.  She is pregnant and baby dominates the abdominal exam.  She is tender with some guarding in the right mid and right lower abdomen, laterally. Rectal: Not done. Extremities:  Good strength and ROM  in upper and lower extremities. Neurologic:  Grossly intact to motor and sensory function. Psychiatric: Has normal mood and affect. Behavior is normal.   DATA REVIEWED: Epic notes  Ovidio Kinavid Tyrene Nader, MD,  [facility] [address]  [address]   [place]    [id number] Phone:  [phone] FAX:  [phone]

## 2015-12-31 ENCOUNTER — Encounter: Payer: Medicaid Other | Admitting: Obstetrics & Gynecology

## 2015-12-31 ENCOUNTER — Inpatient Hospital Stay (HOSPITAL_COMMUNITY): Payer: Medicaid Other | Admitting: Anesthesiology

## 2015-12-31 ENCOUNTER — Encounter (HOSPITAL_COMMUNITY): Payer: Self-pay | Admitting: Anesthesiology

## 2015-12-31 ENCOUNTER — Encounter (HOSPITAL_COMMUNITY)
Admission: AD | Disposition: A | Discharge status: Home / Self Care | Payer: Self-pay | Source: Ambulatory Visit | Attending: Family Medicine

## 2015-12-31 DIAGNOSIS — K37 Unspecified appendicitis: Secondary | ICD-10-CM | POA: Diagnosis present

## 2015-12-31 DIAGNOSIS — Z823 Family history of stroke: Secondary | ICD-10-CM | POA: Diagnosis not present

## 2015-12-31 DIAGNOSIS — Z8249 Family history of ischemic heart disease and other diseases of the circulatory system: Secondary | ICD-10-CM | POA: Diagnosis not present

## 2015-12-31 DIAGNOSIS — Z3A36 36 weeks gestation of pregnancy: Secondary | ICD-10-CM | POA: Diagnosis not present

## 2015-12-31 DIAGNOSIS — O99613 Diseases of the digestive system complicating pregnancy, third trimester: Secondary | ICD-10-CM | POA: Diagnosis present

## 2015-12-31 DIAGNOSIS — O4202 Full-term premature rupture of membranes, onset of labor within 24 hours of rupture: Secondary | ICD-10-CM | POA: Diagnosis not present

## 2015-12-31 DIAGNOSIS — O0933 Supervision of pregnancy with insufficient antenatal care, third trimester: Secondary | ICD-10-CM | POA: Diagnosis not present

## 2015-12-31 DIAGNOSIS — K352 Acute appendicitis with generalized peritonitis: Secondary | ICD-10-CM | POA: Diagnosis present

## 2015-12-31 DIAGNOSIS — K36 Other appendicitis: Secondary | ICD-10-CM | POA: Diagnosis not present

## 2015-12-31 DIAGNOSIS — Z3A37 37 weeks gestation of pregnancy: Secondary | ICD-10-CM | POA: Diagnosis not present

## 2015-12-31 DIAGNOSIS — R102 Pelvic and perineal pain: Secondary | ICD-10-CM | POA: Diagnosis present

## 2015-12-31 DIAGNOSIS — K219 Gastro-esophageal reflux disease without esophagitis: Secondary | ICD-10-CM | POA: Diagnosis present

## 2015-12-31 DIAGNOSIS — O0993 Supervision of high risk pregnancy, unspecified, third trimester: Secondary | ICD-10-CM | POA: Diagnosis not present

## 2015-12-31 HISTORY — PX: LAPAROSCOPIC APPENDECTOMY: SHX408

## 2015-12-31 LAB — ABO/RH
ABO/RH(D): O POS
ABO/RH(D): O POS

## 2015-12-31 LAB — PREPARE RBC (CROSSMATCH)

## 2015-12-31 LAB — RPR: RPR Ser Ql: NONREACTIVE

## 2015-12-31 LAB — CBC
HEMATOCRIT: 30.1 % — AB (ref 36.0–46.0)
HEMOGLOBIN: 10.5 g/dL — AB (ref 12.0–15.0)
MCH: 31.3 pg (ref 26.0–34.0)
MCHC: 34.9 g/dL (ref 30.0–36.0)
MCV: 89.6 fL (ref 78.0–100.0)
Platelets: 305 10*3/uL (ref 150–400)
RBC: 3.36 MIL/uL — AB (ref 3.87–5.11)
RDW: 13.2 % (ref 11.5–15.5)
WBC: 18.6 10*3/uL — AB (ref 4.0–10.5)

## 2015-12-31 LAB — TYPE AND SCREEN
ABO/RH(D): O POS
ANTIBODY SCREEN: NEGATIVE

## 2015-12-31 SURGERY — APPENDECTOMY, LAPAROSCOPIC
Anesthesia: General | Site: Abdomen | Laterality: Right

## 2015-12-31 MED ORDER — DOCUSATE SODIUM 100 MG PO CAPS
100.0000 mg | ORAL_CAPSULE | Freq: Every day | ORAL | Status: DC
  Administered 2015-12-31 – 2016-01-04 (×4): 100 mg via ORAL
  Filled 2015-12-31 (×4): qty 1

## 2015-12-31 MED ORDER — BUPIVACAINE-EPINEPHRINE 0.25% -1:200000 IJ SOLN
INTRAMUSCULAR | Status: DC | PRN
  Administered 2015-12-31: 30 mL

## 2015-12-31 MED ORDER — FENTANYL CITRATE (PF) 100 MCG/2ML IJ SOLN
INTRAMUSCULAR | Status: AC
  Filled 2015-12-31: qty 2

## 2015-12-31 MED ORDER — POTASSIUM CHLORIDE IN NACL 20-0.45 MEQ/L-% IV SOLN: INTRAVENOUS | Status: DC

## 2015-12-31 MED ORDER — FENTANYL CITRATE (PF) 100 MCG/2ML IJ SOLN
INTRAMUSCULAR | Status: DC | PRN
  Administered 2015-12-31 (×5): 50 ug via INTRAVENOUS

## 2015-12-31 MED ORDER — ACETAMINOPHEN 10 MG/ML IV SOLN: 1000.0000 mg | Freq: Once | INTRAVENOUS | Status: DC

## 2015-12-31 MED ORDER — SODIUM CHLORIDE 0.9 % IV SOLN: Freq: Once | INTRAVENOUS | Status: DC

## 2015-12-31 MED ORDER — ATROPINE SULFATE 0.4 MG/ML IJ SOLN
INTRAMUSCULAR | Status: AC
  Filled 2015-12-31: qty 1

## 2015-12-31 MED ORDER — SUCCINYLCHOLINE 20MG/ML (10ML) SYRINGE FOR MEDFUSION PUMP - OPTIME
INTRAMUSCULAR | Status: DC | PRN
  Administered 2015-12-31: 100 mg via INTRAVENOUS

## 2015-12-31 MED ORDER — BUPIVACAINE-EPINEPHRINE 0.25% -1:200000 IJ SOLN
INTRAMUSCULAR | Status: AC
  Filled 2015-12-31: qty 1

## 2015-12-31 MED ORDER — SUGAMMADEX SODIUM 200 MG/2ML IV SOLN
INTRAVENOUS | Status: AC
  Filled 2015-12-31: qty 2

## 2015-12-31 MED ORDER — ACETAMINOPHEN 325 MG PO TABS
650.0000 mg | ORAL_TABLET | ORAL | Status: DC | PRN
  Administered 2016-01-04: 650 mg via ORAL
  Filled 2015-12-31 (×2): qty 2

## 2015-12-31 MED ORDER — LIDOCAINE HCL (CARDIAC) 20 MG/ML IV SOLN
INTRAVENOUS | Status: DC | PRN
  Administered 2015-12-31: 100 mg via INTRAVENOUS

## 2015-12-31 MED ORDER — ONDANSETRON HCL 4 MG/2ML IJ SOLN: 4.0000 mg | Freq: Four times a day (QID) | INTRAMUSCULAR | Status: DC | PRN

## 2015-12-31 MED ORDER — ROCURONIUM BROMIDE 100 MG/10ML IV SOLN
INTRAVENOUS | Status: AC
  Filled 2015-12-31: qty 1

## 2015-12-31 MED ORDER — PHENYLEPHRINE 40 MCG/ML (10ML) SYRINGE FOR IV PUSH (FOR BLOOD PRESSURE SUPPORT)
PREFILLED_SYRINGE | INTRAVENOUS | Status: AC
  Filled 2015-12-31: qty 20

## 2015-12-31 MED ORDER — ONDANSETRON HCL 4 MG/2ML IJ SOLN
INTRAMUSCULAR | Status: DC | PRN
  Administered 2015-12-31: 4 mg via INTRAVENOUS

## 2015-12-31 MED ORDER — CALCIUM CARBONATE ANTACID 500 MG PO CHEW: 2.0000 | CHEWABLE_TABLET | ORAL | Status: DC | PRN

## 2015-12-31 MED ORDER — 0.9 % SODIUM CHLORIDE (POUR BTL) OPTIME
TOPICAL | Status: DC | PRN
  Administered 2015-12-31: 1000 mL

## 2015-12-31 MED ORDER — MORPHINE SULFATE (PF) 4 MG/ML IV SOLN
1.0000 mg | INTRAVENOUS | Status: DC | PRN
  Administered 2015-12-31 (×2): 2 mg via INTRAVENOUS
  Administered 2015-12-31 – 2016-01-01 (×2): 4 mg via INTRAVENOUS
  Filled 2015-12-31 (×5): qty 1

## 2015-12-31 MED ORDER — FENTANYL CITRATE (PF) 250 MCG/5ML IJ SOLN
INTRAMUSCULAR | Status: AC
  Filled 2015-12-31: qty 5

## 2015-12-31 MED ORDER — SODIUM CHLORIDE 0.45 % IV SOLN
INTRAVENOUS | Status: DC
  Administered 2015-12-31 – 2016-01-04 (×7): via INTRAVENOUS
  Filled 2015-12-31 (×12): qty 1000

## 2015-12-31 MED ORDER — CEFEPIME HCL 2 G IJ SOLR
2.0000 g | Freq: Three times a day (TID) | INTRAMUSCULAR | Status: DC
  Administered 2015-12-31 – 2016-01-04 (×13): 2 g via INTRAVENOUS
  Filled 2015-12-31 (×16): qty 2

## 2015-12-31 MED ORDER — NEOSTIGMINE METHYLSULFATE 10 MG/10ML IV SOLN
INTRAVENOUS | Status: AC
  Filled 2015-12-31: qty 1

## 2015-12-31 MED ORDER — LACTATED RINGERS IR SOLN
Status: DC | PRN
  Administered 2015-12-31: 3000 mL

## 2015-12-31 MED ORDER — FENTANYL CITRATE (PF) 100 MCG/2ML IJ SOLN
25.0000 ug | INTRAMUSCULAR | Status: DC | PRN
  Administered 2015-12-31 (×3): 25 ug via INTRAVENOUS

## 2015-12-31 MED ORDER — ROCURONIUM BROMIDE 100 MG/10ML IV SOLN
INTRAVENOUS | Status: DC | PRN
  Administered 2015-12-31: 5 mg via INTRAVENOUS
  Administered 2015-12-31: 10 mg via INTRAVENOUS
  Administered 2015-12-31: 30 mg via INTRAVENOUS

## 2015-12-31 MED ORDER — HYDROCODONE-ACETAMINOPHEN 5-325 MG PO TABS
1.0000 | ORAL_TABLET | ORAL | Status: DC | PRN
  Administered 2015-12-31 – 2016-01-01 (×4): 1 via ORAL
  Administered 2016-01-01 – 2016-01-02 (×6): 2 via ORAL
  Administered 2016-01-02 (×2): 1 via ORAL
  Administered 2016-01-02: 2 via ORAL
  Administered 2016-01-03: 1 via ORAL
  Administered 2016-01-03: 2 via ORAL
  Administered 2016-01-03 – 2016-01-04 (×2): 1 via ORAL
  Filled 2015-12-31 (×2): qty 2
  Filled 2015-12-31 (×3): qty 1
  Filled 2015-12-31: qty 2
  Filled 2015-12-31: qty 1
  Filled 2015-12-31 (×3): qty 2
  Filled 2015-12-31: qty 1
  Filled 2015-12-31: qty 2
  Filled 2015-12-31 (×4): qty 1
  Filled 2015-12-31: qty 2

## 2015-12-31 MED ORDER — ZOLPIDEM TARTRATE 5 MG PO TABS
5.0000 mg | ORAL_TABLET | Freq: Every evening | ORAL | Status: DC | PRN
  Administered 2016-01-03: 5 mg via ORAL
  Filled 2015-12-31: qty 1

## 2015-12-31 MED ORDER — LIDOCAINE HCL (CARDIAC) 20 MG/ML IV SOLN
INTRAVENOUS | Status: AC
  Filled 2015-12-31: qty 5

## 2015-12-31 MED ORDER — ONDANSETRON HCL 4 MG/2ML IJ SOLN: 4.0000 mg | Freq: Once | INTRAMUSCULAR | Status: DC | PRN

## 2015-12-31 MED ORDER — SOD CITRATE-CITRIC ACID 500-334 MG/5ML PO SOLN
30.0000 mL | Freq: Once | ORAL | Status: AC
  Administered 2015-12-31: 15 mL via ORAL
  Filled 2015-12-31: qty 30

## 2015-12-31 MED ORDER — PROPOFOL 10 MG/ML IV BOLUS
INTRAVENOUS | Status: DC | PRN
  Administered 2015-12-31: 150 mg via INTRAVENOUS

## 2015-12-31 MED ORDER — ONDANSETRON 4 MG PO TBDP
4.0000 mg | ORAL_TABLET | Freq: Four times a day (QID) | ORAL | Status: DC | PRN
  Filled 2015-12-31: qty 1

## 2015-12-31 MED ORDER — NEOSTIGMINE METHYLSULFATE 5 MG/5ML IV SOSY
PREFILLED_SYRINGE | INTRAVENOUS | Status: DC | PRN
  Administered 2015-12-31: 4 mg via INTRAVENOUS

## 2015-12-31 MED ORDER — ONDANSETRON HCL 4 MG/2ML IJ SOLN
INTRAMUSCULAR | Status: AC
  Filled 2015-12-31: qty 2

## 2015-12-31 MED ORDER — ATROPINE SULFATE 0.4 MG/ML IJ SOLN
INTRAMUSCULAR | Status: DC | PRN
  Administered 2015-12-31: .7 mg via INTRAVENOUS

## 2015-12-31 MED ORDER — FENTANYL CITRATE (PF) 100 MCG/2ML IJ SOLN
50.0000 ug | Freq: Once | INTRAMUSCULAR | Status: AC
  Administered 2015-12-31: 50 ug via INTRAVENOUS

## 2015-12-31 MED ORDER — LACTATED RINGERS IV SOLN
INTRAVENOUS | Status: DC | PRN
  Administered 2015-12-31 (×2): via INTRAVENOUS

## 2015-12-31 MED ORDER — PRENATAL MULTIVITAMIN CH
1.0000 | ORAL_TABLET | Freq: Every day | ORAL | Status: DC
  Administered 2015-12-31 – 2016-01-03 (×3): 1 via ORAL
  Filled 2015-12-31 (×4): qty 1

## 2015-12-31 MED ORDER — METRONIDAZOLE IN NACL 5-0.79 MG/ML-% IV SOLN
500.0000 mg | Freq: Three times a day (TID) | INTRAVENOUS | Status: DC
  Administered 2015-12-31 – 2016-01-04 (×12): 500 mg via INTRAVENOUS
  Filled 2015-12-31 (×16): qty 100

## 2015-12-31 SURGICAL SUPPLY — 38 items
APPLIER CLIP ROT 10 11.4 M/L (STAPLE)
BENZOIN TINCTURE PRP APPL 2/3 (GAUZE/BANDAGES/DRESSINGS) IMPLANT
CABLE HIGH FREQUENCY MONO STRZ (ELECTRODE) ×3 IMPLANT
CHLORAPREP W/TINT 26ML (MISCELLANEOUS) ×9 IMPLANT
CLIP APPLIE ROT 10 11.4 M/L (STAPLE) IMPLANT
CLOSURE WOUND 1/2 X4 (GAUZE/BANDAGES/DRESSINGS)
COVER SURGICAL LIGHT HANDLE (MISCELLANEOUS) IMPLANT
CUTTER FLEX LINEAR 45M (STAPLE) ×3 IMPLANT
DECANTER SPIKE VIAL GLASS SM (MISCELLANEOUS) ×3 IMPLANT
DRAPE LAPAROSCOPIC ABDOMINAL (DRAPES) ×3 IMPLANT
ELECT REM PT RETURN 9FT ADLT (ELECTROSURGICAL) ×3
ELECTRODE REM PT RTRN 9FT ADLT (ELECTROSURGICAL) ×1 IMPLANT
ENDOLOOP SUT PDS II  0 18 (SUTURE)
ENDOLOOP SUT PDS II 0 18 (SUTURE) IMPLANT
GLOVE SURG SIGNA 7.5 PF LTX (GLOVE) ×3 IMPLANT
GOWN STRL REUS W/TWL XL LVL3 (GOWN DISPOSABLE) ×9 IMPLANT
IRRIG SUCT STRYKERFLOW 2 WTIP (MISCELLANEOUS) ×3
IRRIGATION SUCT STRKRFLW 2 WTP (MISCELLANEOUS) ×1 IMPLANT
KIT BASIN OR (CUSTOM PROCEDURE TRAY) ×3 IMPLANT
LIQUID BAND (GAUZE/BANDAGES/DRESSINGS) ×3 IMPLANT
POUCH SPECIMEN RETRIEVAL 10MM (ENDOMECHANICALS) ×3 IMPLANT
RELOAD 45 VASCULAR/THIN (ENDOMECHANICALS) IMPLANT
RELOAD STAPLE TA45 3.5 REG BLU (ENDOMECHANICALS) ×3 IMPLANT
SCISSORS LAP 5X35 DISP (ENDOMECHANICALS) ×3 IMPLANT
SHEARS HARMONIC ACE PLUS 36CM (ENDOMECHANICALS) ×3 IMPLANT
SLEEVE ADV FIXATION 5X100MM (TROCAR) ×3 IMPLANT
SLEEVE XCEL OPT CAN 5 100 (ENDOMECHANICALS) ×3 IMPLANT
STRIP CLOSURE SKIN 1/2X4 (GAUZE/BANDAGES/DRESSINGS) IMPLANT
SUT MNCRL AB 4-0 PS2 18 (SUTURE) ×3 IMPLANT
SUT VIC AB 2-0 SH 18 (SUTURE) IMPLANT
TOWEL OR 17X26 10 PK STRL BLUE (TOWEL DISPOSABLE) ×3 IMPLANT
TOWEL OR NON WOVEN STRL DISP B (DISPOSABLE) ×3 IMPLANT
TRAY FOLEY W/METER SILVER 14FR (SET/KITS/TRAYS/PACK) IMPLANT
TRAY LAPAROSCOPIC (CUSTOM PROCEDURE TRAY) ×3 IMPLANT
TROCAR ADV FIXATION 5X100MM (TROCAR) ×6 IMPLANT
TROCAR BALLN 12MMX100 BLUNT (TROCAR) ×3 IMPLANT
TROCAR BLADELESS OPT 5 100 (ENDOMECHANICALS) ×3 IMPLANT
TROCAR XCEL BLUNT TIP 100MML (ENDOMECHANICALS) ×3 IMPLANT

## 2015-12-31 NOTE — Progress Notes (Signed)
RROB in PACU for EFM at this time

## 2015-12-31 NOTE — Op Note (Addendum)
Re:   Kimberly KnudsenCourtney Farley DOB:   July 25, 1990 MRN:   [id number]                   [facility]  DATE OF PROCEDURE: 12/31/2015                              OPERATIVE REPORT  PREOPERATIVE DIAGNOSIS:  Appendicitis, [redacted] weeks pregnant  POSTOPERATIVE DIAGNOSIS:  Acute perforated appendicitis  [pictures at the end of the note], [redacted] weeks pregnant  PROCEDURE:  Laparoscopic appendectomy (Pregnancy added difficulty to the operation)  SURGEON:  Sandria Balesavid H. Ezzard StandingNewman, MD  ASSISTANEdythe Lynn:  H. Ingram, M.D.  ANESTHESIA:  General endotracheal.  Anesthesiologist: Linton RumpJennifer Dickerson Allan, MD CRNA: Theodosia QuayKristopher Key, CRNA  ASA:  2E  ESTIMATED BLOOD LOSS:  Minimal.  DRAINS: none   SPECIMEN:   Appendix  COUNTS CORRECT:  YES  INDICATIONS FOR PROCEDURE: Kimberly KnudsenCourtney Farley is a 25 y.o. (DOB: July 25, 1990) AA female whose primary care doctor is No primary care provider on file. and comes to the OR for an appendectomy.   She is approximately [redacted] weeks pregnant.  I have spoken to Dr. Tinnie Gensanya Pratt about the need for surgery.  I discussed with the patient, the indications and potential complications of appendiceal surgery.  The potential complications include, but are not limited to, bleeding, open surgery, bowel resection, and the possibility of another diagnosis.  OPERATIVE NOTE:  The patient underwent a general endotracheal anesthetic as supervised by Anesthesiologist: Linton RumpJennifer Dickerson Allan, MD CRNA: Theodosia QuayKristopher Key, CRNA, General, in room #1.  The patient was given Rocephin prior to the beginning of the procedure and the abdomen was prepped with ChloraPrep.    A time-out was held and surgical checklist run.  Because of her pregnancy, I did not use my normal port sites.  An subxiphoid incision was made with sharp dissection carried down to the abdominal cavity.  A 12 mm Hasson trocar was inserted through the subxiphoid incision and into the peritoneal cavity.  A 30 degree 5 mm laparoscope was inserted through the 12 mm  Hasson trocar and the Hasson trocar secured with a 0 Vicryl suture.  I placed 3 additional trocars: a 5 mm trocar in the right upper quadrant, 5 mm torcar in right lateral quadrant, 5 mm trocar in the midline, and did abdominal exploration.    The right and left lobes of liver unremarkable.  Stomach was unremarkable.  She had a gravid uterus.  The patient had appendicitis with the appendix located right pelvic brim.  The appendix was curled behind the terminal ileum, behind the fimbriae of the right tube.  There was pus in the right colonic gutter and over the right lobe of the liver, consistent with a ruptured appendicitis.  The mesentery of the appendix was divided with a Harmonic scalpel.  The appendix had perforated mid body.  I dissected to the base of the appendix.  I then used a blue load 45 mm Ethicon Endo-GIA stapler and fired this across the base of the appendix.  I placed the appendix in EndoCatch bag and delivered the bag through the umbilical incision.  I irrigated the abdomen with 3,000 cc of saline.  I estimate that the pregnancy added 30 minutes of extra difficulty to the operation.  After irrigating the abdomen, I then removed the trocars, in turn.  The umbilical port fascia was closed with 0 Vicryl suture.   I closed the skin each  site with a 4-0 Monocryl suture and painted the wounds with LiquidBand.  I then injected a total of 30 mL of 0.25% Marcaine at the incisions.  Sponge and needle count were correct at the end of the case.  The foley catheter was removed in the OR.  The patient was transferred to the recovery room in good condition.  The patient tolerated the procedure well and it depends on the patient's post op clinical course as to when the patient could be discharged.   Photos of appendix taken from sub xiphoid camera port  Ovidio Kinavid Julian Medina, MD, [facility] Pager: [phone] Office phone:  [phone]

## 2015-12-31 NOTE — Progress Notes (Signed)
RROB at patient beside; patient in PACU at this time; EFM applied and assessing

## 2015-12-31 NOTE — Anesthesia Postprocedure Evaluation (Signed)
Anesthesia Post Note  Patient: Kimberly KnudsenCourtney Farley  Procedure(s) Performed: Procedure(s) (LRB): APPENDECTOMY LAPAROSCOPIC (Right)  Patient location during evaluation: PACU Anesthesia Type: General Level of consciousness: awake and alert Pain management: pain level controlled Vital Signs Assessment: post-procedure vital signs reviewed and stable Respiratory status: spontaneous breathing, nonlabored ventilation, respiratory function stable and patient connected to nasal cannula oxygen Cardiovascular status: blood pressure returned to baseline and stable Postop Assessment: no signs of nausea or vomiting Anesthetic complications: no Comments: [facility].    Last Vitals:  Vitals:   12/31/15 0255 12/31/15 0300  BP:  117/80  Pulse: 98 94  Resp: (!) 28 (!) 34  Temp:      Last Pain:  Vitals:   12/31/15 0315  TempSrc:   PainSc: 6                  Linton RumpJennifer Dickerson Nicolae Vasek

## 2015-12-31 NOTE — Progress Notes (Signed)
Fentanyl 25mcg wasted in sink.  Fransisco BeauKristy Lashley, RN witnessed waste.  [facility].

## 2015-12-31 NOTE — Transfer of Care (Signed)
Immediate Anesthesia Transfer of Care Note  Patient: Kimberly Farley  Procedure(s) Performed: Procedure(s): APPENDECTOMY LAPAROSCOPIC (Right)  Patient Location: PACU  Anesthesia Type:General  Level of Consciousness:  sedated, patient cooperative and responds to stimulation  Airway & Oxygen Therapy:Patient Spontanous Breathing and Patient connected to face mask oxgen  Post-op Assessment:  Report given to PACU RN and Post -op Vital signs reviewed and stable  Post vital signs:  Reviewed and stable  Last Vitals:  Vitals:   12/30/15 2146 12/31/15 0247  BP: 121/64 (!) 149/86  Pulse: 116 (!) 121  Resp:  (!) 28  Temp: 36.9 C 36.9 C    Complications: No apparent anesthesia complications

## 2015-12-31 NOTE — Progress Notes (Signed)
Pt OOB to bathroom. Voiding without difficulty. Pt ambulated around room and requested to return to bed. Pt rated pain 8/10 and medicated. Carmelina DaneERRI L Deverick Pruss, RN

## 2015-12-31 NOTE — Anesthesia Preprocedure Evaluation (Addendum)
Anesthesia Evaluation  Patient identified by MRN, date of birth, ID band Patient awake    Reviewed: Allergy & Precautions, NPO status , Patient's Chart, lab work & pertinent test results  History of Anesthesia Complications Negative for: history of anesthetic complications  Airway Mallampati: II  TM Distance: >3 FB Neck ROM: Full    Dental  (+) Dental Advisory Given, Teeth Intact   Pulmonary neg pulmonary ROS,    Pulmonary exam normal breath sounds clear to auscultation       Cardiovascular (-) hypertension(-) anginanegative cardio ROS   Rhythm:Regular Rate:Tachycardia     Neuro/Psych negative neurological ROS     GI/Hepatic Neg liver ROS, GERD  Medicated and Poorly Controlled,  Endo/Other  negative endocrine ROS  Renal/GU negative Renal ROS     Musculoskeletal negative musculoskeletal ROS (+)   Abdominal (+) + obese,   Peds  Hematology negative hematology ROS (+)   Anesthesia Other Findings 24yo G1P0 at 36.1 who presents for laparoscopic, possible open, appendectomy for acute appendicitis.  Reproductive/Obstetrics (+) Pregnancy                            Anesthesia Physical Anesthesia Plan  ASA: II and emergent  Anesthesia Plan: General   Post-op Pain Management:    Induction: Intravenous, Rapid sequence and Cricoid pressure planned  Airway Management Planned: Oral ETT  Additional Equipment:   Intra-op Plan:   Post-operative Plan: Extubation in OR  Informed Consent: I have reviewed the patients History and Physical, chart, labs and discussed the procedure including the risks, benefits and alternatives for the proposed anesthesia with the patient or authorized representative who has indicated his/her understanding and acceptance.   Dental advisory given  Plan Discussed with:   Anesthesia Plan Comments: (Called and spoke with Dr. Shawnie PonsPratt, the on-call OB, who stated that the  patient did not need intraop fetal monitoring given that she just had a reassuring NST. [facility] will come to monitor the baby in PACU.  Reassuring airway exam. Will have glidescope in room readily available. Premedicate with Bicitra.  Risks of general anesthesia discussed including, but not limited to, sore throat, hoarse voice, chipped/damaged teeth, injury to vocal cords, nausea and vomiting, allergic reactions, lung infection, heart attack, stroke, and death. Also discussed the risk of going into early labor. All questions answered. )       Anesthesia Quick Evaluation

## 2015-12-31 NOTE — Progress Notes (Signed)
Patient ID: Smitty KnudsenCourtney Farley, female   DOB: 04/13/1991, 25 y.o.   MRN: [id number]   gen surg post op check  Pt with incisional pain but otherwise stable Incisions clean  On clears. Will advance diet as tolerated.  Continue IV antibiotics

## 2015-12-31 NOTE — Progress Notes (Signed)
Hung patient's 2100 dose of Flagyl, and noticed that the 1300 dose was still hanging and full. Notified Dr. Cathlean CowerMikell, in department, that the patient missed 1300 dose.

## 2015-12-31 NOTE — Anesthesia Procedure Notes (Signed)
Procedure Name: Intubation Date/Time: 12/31/2015 1:15 AM Performed by: Marthe Dant, Nuala AlphaKRISTOPHER Pre-anesthesia Checklist: Patient identified, Emergency Drugs available, Suction available, Patient being monitored and Timeout performed Patient Re-evaluated:Patient Re-evaluated prior to inductionOxygen Delivery Method: Circle system utilized Preoxygenation: Pre-oxygenation with 100% oxygen Intubation Type: IV induction, Cricoid Pressure applied and Rapid sequence Laryngoscope Size: Mac and 4 Grade View: Grade I Tube type: Oral Tube size: 7.5 mm Number of attempts: 1 Airway Equipment and Method: Stylet Placement Confirmation: ETT inserted through vocal cords under direct vision,  positive ETCO2,  CO2 detector and breath sounds checked- equal and bilateral Secured at: 21 cm Tube secured with: Tape Dental Injury: Teeth and Oropharynx as per pre-operative assessment

## 2015-12-31 NOTE — H&P (Signed)
ANTEPARTUM ADMISSION HISTORY AND PHYSICAL NOTE   History of Present Illness: Kimberly Farley is a 25 y.o. G1P0 at 4853w2d admitted for post-op laparoscopic appendectomy.  Patient arrived to MAU earlier this evening, [facility] due to suspicious for appendicitis. Patient was taken to the OR for lap appy, found to have a ruptured appendicitis. [facility] due to [redacted] week pregnant.    Patient reports the fetal movement as active. Patient reports uterine contraction  activity as irregular, cramping. Patient reports  vaginal bleeding as none. Patient describes fluid per vagina as None. Fetal presentation is cephalic.  Patient Active Problem List   Diagnosis Date Noted  . Appendicitis 12/31/2015  . Supervision of normal first pregnancy 11/10/2015  . Late prenatal care affecting pregnancy in third trimester, antepartum 11/10/2015    Past Medical History:  Diagnosis Date  . Medical history non-contributory     Past Surgical History:  Procedure Laterality Date  . NO PAST SURGERIES      OB History  Gravida Para Term Preterm AB Living  1            SAB TAB Ectopic Multiple Live Births               # Outcome Date GA Lbr Len/2nd Weight Sex Delivery Anes PTL Lv  1 Current               Social History   Social History  . Marital status: Single    Spouse name: N/A  . Number of children: N/A  . Years of education: N/A   Social History Main Topics  . Smoking status: Never Smoker  . Smokeless tobacco: Never Used  . Alcohol use Yes     Comment: socially until pregnancy  . Drug use: No  . Sexual activity: Not Currently    Birth control/ protection: None   Other Topics Concern  . None   Social History Narrative  . None    Family History  Problem Relation Age of Onset  . Hypertension Mother   . Hypertension Maternal Grandmother   . Stroke Maternal Grandmother   . Hearing loss Maternal Grandmother   . Alcohol  abuse Maternal Grandfather     Allergies  Allergen Reactions  . Penicillins     Prescriptions Prior to Admission  Medication Sig Dispense Refill Last Dose  . pantoprazole (PROTONIX) 40 MG tablet Take 1 tablet (40 mg total) by mouth daily. 30 tablet 2 Past Month at Unknown time  . Prenatal Vit-Fe Fumarate-FA (PRENATAL MULTIVITAMIN) TABS tablet Take 1 tablet by mouth daily at 12 noon.   12/30/2015 at Unknown time    Review of Systems - Negative except per HPI.  Vitals:  BP 113/70   Pulse 94   Temp 98.1 F (36.7 C)   Resp (!) 22   Ht 5\' 1"  (1.549 m)   Wt 170 lb (77.1 kg)   SpO2 95%   BMI 32.12 kg/m  Physical Examination: CONSTITUTIONAL: Well-developed, well-nourished female in no acute distress.  HENT:  Normocephalic, atraumatic EYES: Conjunctivae and EOM are normal.  NECK: Normal range of motion, supple, no masses SKIN: Skin is warm and dry. No rash noted. Not diaphoretic. No erythema. No pallor. NEUROLGIC: Alert and oriented to person, place, and time. Normal reflexes, muscle tone coordination. No cranial nerve deficit noted. PSYCHIATRIC: Normal mood and affect. Normal behavior. Normal judgment and thought content. CARDIOVASCULAR: Normal heart rate noted, regular  rhythm RESPIRATORY: Effort and breath sounds normal, no problems with respiration noted ABDOMEN: Soft, appropriate tenderness, gravid. MUSCULOSKELETAL: Normal range of motion. Trace edema and no tenderness. 2+ distal pulses.  Fetal Monitoring:Baseline: 135 bpm, Variability: moderate, Accelerations: present and Decelerations: Absent Tocometer: Irritable when arrived, improving.  Labs:  Results for orders placed or performed during the hospital encounter of 12/30/15 (from the past 24 hour(s))  Urinalysis, Routine w reflex microscopic (not at [facility])   Collection Time: 12/30/15  5:50 PM  Result Value Ref Range   Color, Urine YELLOW YELLOW   APPearance HAZY (A) CLEAR   Specific Gravity, Urine >1.030 (H) 1.005 - 1.030    pH 6.0 5.0 - 8.0   Glucose, UA NEGATIVE NEGATIVE mg/dL   Hgb urine dipstick TRACE (A) NEGATIVE   Bilirubin Urine SMALL (A) NEGATIVE   Ketones, ur >80 (A) NEGATIVE mg/dL   Protein, ur 161 (A) NEGATIVE mg/dL   Nitrite NEGATIVE NEGATIVE   Leukocytes, UA NEGATIVE NEGATIVE  Urine microscopic-add on   Collection Time: 12/30/15  5:50 PM  Result Value Ref Range   Squamous Epithelial / LPF 6-30 (A) NONE SEEN   WBC, UA 0-5 0 - 5 WBC/hpf   RBC / HPF NONE SEEN 0 - 5 RBC/hpf   Bacteria, UA FEW (A) NONE SEEN   Casts HYALINE CASTS (A) NEGATIVE  CBC   Collection Time: 12/30/15  7:15 PM  Result Value Ref Range   WBC 18.5 (H) 4.0 - 10.5 K/uL   RBC 3.75 (L) 3.87 - 5.11 MIL/uL   Hemoglobin 11.8 (L) 12.0 - 15.0 g/dL   HCT 09.6 (L) 04.5 - 40.9 %   MCV 87.2 78.0 - 100.0 fL   MCH 31.5 26.0 - 34.0 pg   MCHC 36.1 (H) 30.0 - 36.0 g/dL   RDW 81.1 91.4 - 78.2 %   Platelets 354 150 - 400 K/uL  Comprehensive metabolic panel   Collection Time: 12/30/15  9:59 PM  Result Value Ref Range   Sodium 134 (L) 135 - 145 mmol/L   Potassium 3.5 3.5 - 5.1 mmol/L   Chloride 105 101 - 111 mmol/L   CO2 16 (L) 22 - 32 mmol/L   Glucose, Bld 103 (H) 65 - 99 mg/dL   BUN 6 6 - 20 mg/dL   Creatinine, Ser 9.56 0.44 - 1.00 mg/dL   Calcium 9.8 8.9 - 21.3 mg/dL   Total Protein 7.5 6.5 - 8.1 g/dL   Albumin 2.9 (L) 3.5 - 5.0 g/dL   AST 20 15 - 41 U/L   ALT 16 14 - 54 U/L   Alkaline Phosphatase 116 38 - 126 U/L   Total Bilirubin 1.1 0.3 - 1.2 mg/dL   GFR calc non Af Amer >60 >60 mL/min   GFR calc Af Amer >60 >60 mL/min   Anion gap 13 5 - 15  CBC with Differential   Collection Time: 12/30/15  9:59 PM  Result Value Ref Range   WBC 17.7 (H) 4.0 - 10.5 K/uL   RBC 4.26 3.87 - 5.11 MIL/uL   Hemoglobin 13.3 12.0 - 15.0 g/dL   HCT 08.6 57.8 - 46.9 %   MCV 87.3 78.0 - 100.0 fL   MCH 31.2 26.0 - 34.0 pg   MCHC 35.8 30.0 - 36.0 g/dL   RDW 62.9 52.8 - 41.3 %   Platelets 283 150 - 400 K/uL   Neutrophils Relative % 90 %    Lymphocytes Relative 4 %   Monocytes Relative 6 %   Eosinophils  Relative 0 %   Basophils Relative 0 %   Neutro Abs 15.9 (H) 1.7 - 7.7 K/uL   Lymphs Abs 0.7 0.7 - 4.0 K/uL   Monocytes Absolute 1.1 (H) 0.1 - 1.0 K/uL   Eosinophils Absolute 0.0 0.0 - 0.7 K/uL   Basophils Absolute 0.0 0.0 - 0.1 K/uL   Smear Review MORPHOLOGY UNREMARKABLE   Lipase, blood   Collection Time: 12/30/15  9:59 PM  Result Value Ref Range   Lipase 57 (H) 11 - 51 U/L  Type and screen   Collection Time: 12/31/15  1:02 AM  Result Value Ref Range   ABO/RH(D) O POS    Antibody Screen NEG    Sample Expiration 01/03/2016    Unit Number [id number]    Blood Component Type RED CELLS,LR    Unit division 00    Status of Unit ALLOCATED    Transfusion Status OK TO TRANSFUSE    Crossmatch Result Compatible    Unit Number [id number]    Blood Component Type RED CELLS,LR    Unit division 00    Status of Unit ALLOCATED    Transfusion Status OK TO TRANSFUSE    Crossmatch Result Compatible   ABO/Rh   Collection Time: 12/31/15  1:02 AM  Result Value Ref Range   ABO/RH(D) O POS   Prepare RBC   Collection Time: 12/31/15  1:03 AM  Result Value Ref Range   Order Confirmation ORDER PROCESSED BY BLOOD BANK     Imaging Studies: Mr Pelvis Wo Contrast  Result Date: 12/30/2015 CLINICAL DATA:  Pregnant, nausea/vomiting, leukocytosis. EXAM: MRI ABDOMEN AND PELVIS WITHOUT CONTRAST TECHNIQUE: Multiplanar multisequence MR imaging of the abdomen and pelvis was performed. No intravenous contrast was administered. COMPARISON:  None. FINDINGS: Lower chest:  Lung bases are clear. Hepatobiliary: Liver is within normal limits. Gallbladder is unremarkable. No intrahepatic or extrahepatic ductal dilatation. Pancreas: Within normal limits. Spleen: Within normal limits. Adrenals/Urinary Tract: Adrenal glands are within normal limits. 13 mm cyst along the anterior interpolar left kidney (series 6/ image 5). Right kidney is within normal  limits. Mild bilateral hydronephrosis, right greater than left. Bladder is within normal limits. Stomach/Bowel: Stomach is within normal limits. No evidence of bowel obstruction. Prominent appendix, measuring 12 mm (series 3/ image 22), although poorly visualized. Associated periappendiceal fluid in the right lower quadrant (series 4/image 19). Vascular/Lymphatic: No evidence of abdominal aortic aneurysm. No suspicious abdominopelvic lymphadenopathy. Reproductive: Gravid uterus. Placenta is anterior and free of the cervical os. Dedicated fetal evaluation was not performed.  Vertex position. Other: Trace fluid in the right lower quadrant. Musculoskeletal: No focal osseous lesions. IMPRESSION: Mildly prominent appendix, poorly visualized, but with associated periappendiceal fluid in the right lower quadrant. This appearance is considered worrisome for acute appendicitis. These results were called by telephone at the time of interpretation on 12/30/2015 at 11:13 pm to [facility], PA, who verbally acknowledged these results. Electronically Signed   By: Charline BillsSriyesh  Krishnan M.D.   On: 12/30/2015 23:14   Mr Abdomen Wo Contrast  Result Date: 12/30/2015 CLINICAL DATA:  Pregnant, nausea/vomiting, leukocytosis. EXAM: MRI ABDOMEN AND PELVIS WITHOUT CONTRAST TECHNIQUE: Multiplanar multisequence MR imaging of the abdomen and pelvis was performed. No intravenous contrast was administered. COMPARISON:  None. FINDINGS: Lower chest:  Lung bases are clear. Hepatobiliary: Liver is within normal limits. Gallbladder is unremarkable. No intrahepatic or extrahepatic ductal dilatation. Pancreas: Within normal limits. Spleen: Within normal limits. Adrenals/Urinary Tract: Adrenal glands are within normal limits. 13 mm cyst along the anterior interpolar  left kidney (series 6/ image 5). Right kidney is within normal limits. Mild bilateral hydronephrosis, right greater than left. Bladder is within normal limits. Stomach/Bowel: Stomach is  within normal limits. No evidence of bowel obstruction. Prominent appendix, measuring 12 mm (series 3/ image 22), although poorly visualized. Associated periappendiceal fluid in the right lower quadrant (series 4/image 19). Vascular/Lymphatic: No evidence of abdominal aortic aneurysm. No suspicious abdominopelvic lymphadenopathy. Reproductive: Gravid uterus. Placenta is anterior and free of the cervical os. Dedicated fetal evaluation was not performed.  Vertex position. Other: Trace fluid in the right lower quadrant. Musculoskeletal: No focal osseous lesions. IMPRESSION: Mildly prominent appendix, poorly visualized, but with associated periappendiceal fluid in the right lower quadrant. This appearance is considered worrisome for acute appendicitis. These results were called by telephone at the time of interpretation on 12/30/2015 at 11:13 pm to [facility], PA, who verbally acknowledged these results. Electronically Signed   By: Charline Bills M.D.   On: 12/30/2015 23:14   [place] Mfm Ob Follow Up  Result Date: 12/11/2015 OBSTETRICAL ULTRASOUND: This exam was performed within a [place] Ultrasound Department. The OB US report was generated in the [facility] system, and faxed to the ordering physician.  This report is available in the [employer]. See the [facility] Obstetric US report via the Image Link.    Assessment and Plan: Patient Active Problem List   Diagnosis Date Noted  . Appendicitis 12/31/2015  . Supervision of normal first pregnancy 11/10/2015  . Late prenatal care affecting pregnancy in third trimester, antepartum 11/10/2015   POD#0 Lap Appy for ruptured appendicitis.  Admit to Antenatal Routine antenatal care IV Cefepime/Flagyl IV Tylenol IV Fentanyl/morphine  Jen Mow, DO OB Fellow Faculty Practice, [facility]

## 2015-12-31 NOTE — Progress Notes (Signed)
Pt resting without complaint. EFM 130. Pt off EFM at this time. Carmelina DaneERRI L Aylen Stradford, RN

## 2016-01-01 LAB — CBC
HEMATOCRIT: 29.4 % — AB (ref 36.0–46.0)
HEMOGLOBIN: 10.4 g/dL — AB (ref 12.0–15.0)
MCH: 31 pg (ref 26.0–34.0)
MCHC: 35.4 g/dL (ref 30.0–36.0)
MCV: 87.8 fL (ref 78.0–100.0)
Platelets: 303 10*3/uL (ref 150–400)
RBC: 3.35 MIL/uL — AB (ref 3.87–5.11)
RDW: 13.7 % (ref 11.5–15.5)
WBC: 16.9 10*3/uL — AB (ref 4.0–10.5)

## 2016-01-01 NOTE — Progress Notes (Signed)
No complaints at this time.  Continues to sit in recliner at bedside.

## 2016-01-01 NOTE — Progress Notes (Signed)
Notified Dr. Cathlean CowerMikell about increase in pt temp to 99.6; no new orders given

## 2016-01-01 NOTE — Progress Notes (Signed)
NST reactive ad reassuring.

## 2016-01-01 NOTE — Progress Notes (Signed)
Resident updated on pt VS/pain management.  No new orders noted. Discussed POC regarding pain management with pt.  Will continue Vicodin q4hours at this time.  Pt verbalized understanding.

## 2016-01-01 NOTE — Progress Notes (Signed)
1 Day Post-Op  Subjective: Feels better Tolerating po  Objective: Vital signs in last 24 hours: Temp:  [97.9 F (36.6 C)-100.2 F (37.9 C)] 98.8 F (37.1 C) (08/24 1235) Pulse Rate:  [99-117] 99 (08/24 1430) Resp:  [16-20] 16 (08/24 1430) BP: (111-128)/(58-73) 120/60 (08/24 1430)    Intake/Output from previous day: 08/23 0701 - 08/24 0700 In: 1480 [P.O.:1280; IV Piggyback:200] Out: 4550 [Urine:4550] Intake/Output this shift: Total I/O In: -  Out: 600 [Urine:600]  Abdomen soft, gravid Incisions clean  Lab Results:   Recent Labs  12/31/15 0625 01/01/16 0624  WBC 18.6* 16.9*  HGB 10.5* 10.4*  HCT 30.1* 29.4*  PLT 305 303   BMET  Recent Labs  12/30/15 2159  NA 134*  K 3.5  CL 105  CO2 16*  GLUCOSE 103*  BUN 6  CREATININE 0.81  CALCIUM 9.8   PT/INR No results for input(s): LABPROT, INR in the last 72 hours. ABG No results for input(s): PHART, HCO3 in the last 72 hours.  Invalid input(s): PCO2, PO2  Studies/Results: Mr Pelvis Wo Contrast  Result Date: 12/30/2015 CLINICAL DATA:  Pregnant, nausea/vomiting, leukocytosis. EXAM: MRI ABDOMEN AND PELVIS WITHOUT CONTRAST TECHNIQUE: Multiplanar multisequence MR imaging of the abdomen and pelvis was performed. No intravenous contrast was administered. COMPARISON:  None. FINDINGS: Lower chest:  Lung bases are clear. Hepatobiliary: Liver is within normal limits. Gallbladder is unremarkable. No intrahepatic or extrahepatic ductal dilatation. Pancreas: Within normal limits. Spleen: Within normal limits. Adrenals/Urinary Tract: Adrenal glands are within normal limits. 13 mm cyst along the anterior interpolar left kidney (series 6/ image 5). Right kidney is within normal limits. Mild bilateral hydronephrosis, right greater than left. Bladder is within normal limits. Stomach/Bowel: Stomach is within normal limits. No evidence of bowel obstruction. Prominent appendix, measuring 12 mm (series 3/ image 22), although poorly  visualized. Associated periappendiceal fluid in the right lower quadrant (series 4/image 19). Vascular/Lymphatic: No evidence of abdominal aortic aneurysm. No suspicious abdominopelvic lymphadenopathy. Reproductive: Gravid uterus. Placenta is anterior and free of the cervical os. Dedicated fetal evaluation was not performed.  Vertex position. Other: Trace fluid in the right lower quadrant. Musculoskeletal: No focal osseous lesions. IMPRESSION: Mildly prominent appendix, poorly visualized, but with associated periappendiceal fluid in the right lower quadrant. This appearance is considered worrisome for acute appendicitis. These results were called by telephone at the time of interpretation on 12/30/2015 at 11:13 pm to [facility], who verbally acknowledged these results. Electronically Signed   By: Charline BillsSriyesh  Krishnan M.D.   On: 12/30/2015 23:14   Mr Abdomen Wo Contrast  Result Date: 12/30/2015 CLINICAL DATA:  Pregnant, nausea/vomiting, leukocytosis. EXAM: MRI ABDOMEN AND PELVIS WITHOUT CONTRAST TECHNIQUE: Multiplanar multisequence MR imaging of the abdomen and pelvis was performed. No intravenous contrast was administered. COMPARISON:  None. FINDINGS: Lower chest:  Lung bases are clear. Hepatobiliary: Liver is within normal limits. Gallbladder is unremarkable. No intrahepatic or extrahepatic ductal dilatation. Pancreas: Within normal limits. Spleen: Within normal limits. Adrenals/Urinary Tract: Adrenal glands are within normal limits. 13 mm cyst along the anterior interpolar left kidney (series 6/ image 5). Right kidney is within normal limits. Mild bilateral hydronephrosis, right greater than left. Bladder is within normal limits. Stomach/Bowel: Stomach is within normal limits. No evidence of bowel obstruction. Prominent appendix, measuring 12 mm (series 3/ image 22), although poorly visualized. Associated periappendiceal fluid in the right lower quadrant (series 4/image 19). Vascular/Lymphatic: No evidence of  abdominal aortic aneurysm. No suspicious abdominopelvic lymphadenopathy. Reproductive: Gravid uterus. Placenta is anterior  and free of the cervical os. Dedicated fetal evaluation was not performed.  Vertex position. Other: Trace fluid in the right lower quadrant. Musculoskeletal: No focal osseous lesions. IMPRESSION: Mildly prominent appendix, poorly visualized, but with associated periappendiceal fluid in the right lower quadrant. This appearance is considered worrisome for acute appendicitis. These results were called by telephone at the time of interpretation on 12/30/2015 at 11:13 pm to [facility], who verbally acknowledged these results. Electronically Signed   By: Charline BillsSriyesh  Krishnan M.D.   On: 12/30/2015 23:14    Anti-infectives: Anti-infectives    Start     Dose/Rate Route Frequency Ordered Stop   12/31/15 0500  ceFEPIme (MAXIPIME) 2 g in dextrose 5 % 50 mL IVPB     2 g 100 mL/hr over 30 Minutes Intravenous Every 8 hours 12/31/15 0459     12/31/15 0459  metroNIDAZOLE (FLAGYL) IVPB 500 mg     500 mg 100 mL/hr over 60 Minutes Intravenous Every 8 hours 12/31/15 0459     12/30/15 2330  cefTRIAXone (ROCEPHIN) 1 g in dextrose 5 % 50 mL IVPB     1 g 100 mL/hr over 30 Minutes Intravenous  Once 12/30/15 2328 12/31/15 0032      Assessment/Plan: s/p Procedure(s): APPENDECTOMY LAPAROSCOPIC (Right)  WBC improved.  Will keep on IV antibiotics.  Would typically keep in hospital until WBC normal but I'm not sure she will get to normal given pregnancy  LOS: 1 day    Dace Denn A 01/01/2016

## 2016-01-01 NOTE — Progress Notes (Signed)
Three sites on abdomen with no swelling s/s of infection.  Bruising noted around each site.  Open to air.

## 2016-01-02 DIAGNOSIS — K352 Acute appendicitis with generalized peritonitis: Secondary | ICD-10-CM

## 2016-01-02 LAB — CBC
HCT: 29.7 % — ABNORMAL LOW (ref 36.0–46.0)
Hemoglobin: 10.5 g/dL — ABNORMAL LOW (ref 12.0–15.0)
MCH: 30.8 pg (ref 26.0–34.0)
MCHC: 35.4 g/dL (ref 30.0–36.0)
MCV: 87.1 fL (ref 78.0–100.0)
PLATELETS: 348 10*3/uL (ref 150–400)
RBC: 3.41 MIL/uL — ABNORMAL LOW (ref 3.87–5.11)
RDW: 13.6 % (ref 11.5–15.5)
WBC: 15.2 10*3/uL — ABNORMAL HIGH (ref 4.0–10.5)

## 2016-01-02 LAB — COMPREHENSIVE METABOLIC PANEL
ALT: 14 U/L (ref 14–54)
ANION GAP: 11 (ref 5–15)
AST: 20 U/L (ref 15–41)
Albumin: 2.6 g/dL — ABNORMAL LOW (ref 3.5–5.0)
Alkaline Phosphatase: 125 U/L (ref 38–126)
BUN: 5 mg/dL — ABNORMAL LOW (ref 6–20)
CHLORIDE: 106 mmol/L (ref 101–111)
CO2: 15 mmol/L — AB (ref 22–32)
Calcium: 10.5 mg/dL — ABNORMAL HIGH (ref 8.9–10.3)
Creatinine, Ser: 0.75 mg/dL (ref 0.44–1.00)
Glucose, Bld: 71 mg/dL (ref 65–99)
POTASSIUM: 3.5 mmol/L (ref 3.5–5.1)
SODIUM: 132 mmol/L — AB (ref 135–145)
Total Bilirubin: 0.5 mg/dL (ref 0.3–1.2)
Total Protein: 7.7 g/dL (ref 6.5–8.1)

## 2016-01-02 NOTE — Progress Notes (Signed)
ANTEPARTUM PROGRESS NOTE  Kimberly Farley is a 25 y.o. G1P0 at 5241w4d  who is admitted for S/P Lap Appy.    Length of Stay:  2  Days  Subjective: Pt doing well, minimal pain. Moving around some today.  Patient reports good fetal movement.  She reports occasional uterine contractions, no bleeding and no loss of fluid per vagina.  Vitals:  Blood pressure 126/77, pulse 97, temperature 98 F (36.7 C), temperature source Oral, resp. rate 15, height 5\' 1"  (1.549 m), weight 170 lb (77.1 kg), SpO2 95 %. Physical Examination:  Gen: Well appearing NAD Pulm: no respiratory distress, no wheezing CV: RR distal pulses intact Abd: 3 incisions c/d/i. Gravid, minimally tender Ext: 1+ swelling B/l  Fetal Monitoring: NST reactive  Labs:  Results for orders placed or performed during the hospital encounter of 12/30/15 (from the past 24 hour(s))  CBC   Collection Time: 01/02/16  5:11 AM  Result Value Ref Range   WBC 15.2 (H) 4.0 - 10.5 K/uL   RBC 3.41 (L) 3.87 - 5.11 MIL/uL   Hemoglobin 10.5 (L) 12.0 - 15.0 g/dL   HCT 40.929.7 (L) 81.136.0 - 91.446.0 %   MCV 87.1 78.0 - 100.0 fL   MCH 30.8 26.0 - 34.0 pg   MCHC 35.4 30.0 - 36.0 g/dL   RDW 78.213.6 95.611.5 - 21.315.5 %   Platelets 348 150 - 400 K/uL      Medications:  Scheduled . ceFEPime (MAXIPIME) IV  2 g Intravenous Q8H   And  . metronidazole  500 mg Intravenous Q8H  . docusate sodium  100 mg Oral Daily  . prenatal multivitamin  1 tablet Oral Q1200     ASSESSMENT: Patient Active Problem List   Diagnosis Date Noted  . Appendicitis 12/31/2015  . Supervision of normal first pregnancy 11/10/2015  . Late prenatal care affecting pregnancy in third trimester, antepartum 11/10/2015    PLAN: 1. Routine prenatal care 2. POD#2 from Lap appendectomy: management per surgery. 1-2 days on IV antibiotics then D/C on PO antibiotics. Pt has been afebrile.    Continue routine antenatal care.   Kimberly Farley 01/02/2016,4:56 PM

## 2016-01-02 NOTE — Progress Notes (Signed)
2 Days Post-Op  Subjective: Doing well  comfortable  Objective: Vital signs in last 24 hours: Temp:  [98 F (36.7 C)-100.2 F (37.9 C)] 98 F (36.7 C) (08/25 1139) Pulse Rate:  [89-115] 97 (08/25 1139) Resp:  [15-18] 15 (08/25 1139) BP: (114-126)/(67-86) 126/77 (08/25 1139)    Intake/Output from previous day: 08/24 0701 - 08/25 0700 In: -  Out: 600 [Urine:600] Intake/Output this shift: No intake/output data recorded.  Abdomen soft, incisions clean  Lab Results:   Recent Labs  01/01/16 0624 01/02/16 0511  WBC 16.9* 15.2*  HGB 10.4* 10.5*  HCT 29.4* 29.7*  PLT 303 348   BMET  Recent Labs  12/30/15 2159  NA 134*  K 3.5  CL 105  CO2 16*  GLUCOSE 103*  BUN 6  CREATININE 0.81  CALCIUM 9.8   PT/INR No results for input(s): LABPROT, INR in the last 72 hours. ABG No results for input(s): PHART, HCO3 in the last 72 hours.  Invalid input(s): PCO2, PO2  Studies/Results: No results found.  Anti-infectives: Anti-infectives    Start     Dose/Rate Route Frequency Ordered Stop   12/31/15 0500  ceFEPIme (MAXIPIME) 2 g in dextrose 5 % 50 mL IVPB     2 g 100 mL/hr over 30 Minutes Intravenous Every 8 hours 12/31/15 0459     12/31/15 0459  metroNIDAZOLE (FLAGYL) IVPB 500 mg     500 mg 100 mL/hr over 60 Minutes Intravenous Every 8 hours 12/31/15 0459     12/30/15 2330  cefTRIAXone (ROCEPHIN) 1 g in dextrose 5 % 50 mL IVPB     1 g 100 mL/hr over 30 Minutes Intravenous  Once 12/30/15 2328 12/31/15 0032      Assessment/Plan: s/p Procedure(s): APPENDECTOMY LAPAROSCOPIC (Right)  Hopefully home in next 1 to 2 days on oral antibiotics  LOS: 2 days    Addalie Calles A 01/02/2016

## 2016-01-03 DIAGNOSIS — O0990 Supervision of high risk pregnancy, unspecified, unspecified trimester: Secondary | ICD-10-CM

## 2016-01-03 DIAGNOSIS — K36 Other appendicitis: Secondary | ICD-10-CM

## 2016-01-03 DIAGNOSIS — O0993 Supervision of high risk pregnancy, unspecified, third trimester: Secondary | ICD-10-CM

## 2016-01-03 LAB — CBC
HCT: 29.9 % — ABNORMAL LOW (ref 36.0–46.0)
Hemoglobin: 10.5 g/dL — ABNORMAL LOW (ref 12.0–15.0)
MCH: 30.6 pg (ref 26.0–34.0)
MCHC: 35.1 g/dL (ref 30.0–36.0)
MCV: 87.2 fL (ref 78.0–100.0)
PLATELETS: 392 10*3/uL (ref 150–400)
RBC: 3.43 MIL/uL — AB (ref 3.87–5.11)
RDW: 13.7 % (ref 11.5–15.5)
WBC: 14.8 10*3/uL — ABNORMAL HIGH (ref 4.0–10.5)

## 2016-01-03 NOTE — Progress Notes (Signed)
ANTEPARTUM PROGRESS NOTE  Kimberly KnudsenCourtney Farley is a 25 y.o. G1P0 at 5274w5d  who is admitted for s/p lap appendectomy.  Length of Stay:  3  Days  Subjective: Patient is doing well, pain well-controlled. She has been up and walking around the hospital twice today. Patient reports good fetal movement.   Vitals:  Blood pressure 126/81, pulse 74, temperature 99 F (37.2 C), temperature source Oral, resp. rate (!) 24, height 5\' 1"  (1.549 m), weight 77.1 kg (170 lb), SpO2 95 %.  Labs:  Results for orders placed or performed during the hospital encounter of 12/30/15 (from the past 24 hour(s))  Comprehensive metabolic panel   Collection Time: 01/02/16  8:02 PM  Result Value Ref Range   Sodium 132 (L) 135 - 145 mmol/L   Potassium 3.5 3.5 - 5.1 mmol/L   Chloride 106 101 - 111 mmol/L   CO2 15 (L) 22 - 32 mmol/L   Glucose, Bld 71 65 - 99 mg/dL   BUN 5 (L) 6 - 20 mg/dL   Creatinine, Ser 8.290.75 0.44 - 1.00 mg/dL   Calcium 56.210.5 (H) 8.9 - 10.3 mg/dL   Total Protein 7.7 6.5 - 8.1 g/dL   Albumin 2.6 (L) 3.5 - 5.0 g/dL   AST 20 15 - 41 U/L   ALT 14 14 - 54 U/L   Alkaline Phosphatase 125 38 - 126 U/L   Total Bilirubin 0.5 0.3 - 1.2 mg/dL   GFR calc non Af Amer >60 >60 mL/min   GFR calc Af Amer >60 >60 mL/min   Anion gap 11 5 - 15  CBC   Collection Time: 01/03/16  6:20 AM  Result Value Ref Range   WBC 14.8 (H) 4.0 - 10.5 K/uL   RBC 3.43 (L) 3.87 - 5.11 MIL/uL   Hemoglobin 10.5 (L) 12.0 - 15.0 g/dL   HCT 13.029.9 (L) 86.536.0 - 78.446.0 %   MCV 87.2 78.0 - 100.0 fL   MCH 30.6 26.0 - 34.0 pg   MCHC 35.1 30.0 - 36.0 g/dL   RDW 69.613.7 29.511.5 - 28.415.5 %   Platelets 392 150 - 400 K/uL   Medications:  Scheduled . ceFEPime (MAXIPIME) IV  2 g Intravenous Q8H   And  . metronidazole  500 mg Intravenous Q8H  . docusate sodium  100 mg Oral Daily  . prenatal multivitamin  1 tablet Oral Q1200   ASSESSMENT: Patient Active Problem List   Diagnosis Date Noted  . Pregnancy, supervision, high-risk 01/03/2016  . Appendicitis  12/31/2015  . Supervision of normal first pregnancy 11/10/2015  . Late prenatal care affecting pregnancy in third trimester, antepartum 11/10/2015    PLAN: POD #3 from lap appendectomy: management per surgery. Continue IV antibiotics and d/c on PO antibiotics. Pt remains afebrile. Continue to encourage mobilization while inpatient. Continue routine antenatal care.   Ambrose FinlandGabriela E Alexx Mcburney 01/03/2016,7:34 PM

## 2016-01-03 NOTE — Progress Notes (Signed)
Patient ID: Kimberly Farley, female   DOB: 05/02/1991, 25 y.o.   MRN: [id number] [facility] ANTEPARTUM COMPREHENSIVE PROGRESS NOTE  Josefita Weissmann is a 25 y.o. G1P0 at [redacted]w[redacted]d  who is admitted for post op management after appendectomy in 25 week gravida.   Fetal presentation is cephalic. Length of Stay:  3  Days  Subjective: Pt reports pain in abd. She reports lack of sleep due to being awakened. She has not been very active.  Did get up yesterday to go to the [facility]   Patient reports good fetal movement.  She reports no uterine contractions, no bleeding and no loss of fluid per vagina.  Vitals:  Blood pressure 119/73, pulse (!) 110, temperature 99.4 F (37.4 C), temperature source Oral, resp. rate 20, height 5\' 1"  (1.549 m), weight 170 lb (77.1 kg), SpO2 95 %. Physical Examination: General appearance - alert, well appearing, and in no distress Chest - clear to auscultation, no wheezes, rales or rhonchi, symmetric air entry Heart - normal rate, regular rhythm, normal S1, S2, no murmurs, rubs, clicks or gallops Abdomen - gravid and appropriately tender postop Cervical Exam: Not evaluated.  Extremities: extremities normal, atraumatic, no cyanosis or edema Membranes:intact  Fetal Monitoring:  Baseline: 150's bpm, Variability: Good {> 6 bpm), Accelerations: Non-reactive but appropriate for gestational age and Decelerations: Absent  Labs:  Results for orders placed or performed during the hospital encounter of 12/30/15 (from the past 24 hour(s))  Comprehensive metabolic panel   Collection Time: 01/02/16  8:02 PM  Result Value Ref Range   Sodium 132 (L) 135 - 145 mmol/L   Potassium 3.5 3.5 - 5.1 mmol/L   Chloride 106 101 - 111 mmol/L   CO2 15 (L) 22 - 32 mmol/L   Glucose, Bld 71 65 - 99 mg/dL   BUN 5 (L) 6 - 20 mg/dL   Creatinine, Ser 4.09 0.44 - 1.00 mg/dL   Calcium 81.1 (H) 8.9 - 10.3 mg/dL   Total Protein 7.7 6.5 - 8.1 g/dL   Albumin 2.6 (L) 3.5 - 5.0 g/dL   AST 20 15 - 41 U/L   ALT  14 14 - 54 U/L   Alkaline Phosphatase 125 38 - 126 U/L   Total Bilirubin 0.5 0.3 - 1.2 mg/dL   GFR calc non Af Amer >60 >60 mL/min   GFR calc Af Amer >60 >60 mL/min   Anion gap 11 5 - 15  CBC   Collection Time: 01/03/16  6:20 AM  Result Value Ref Range   WBC 14.8 (H) 4.0 - 10.5 K/uL   RBC 3.43 (L) 3.87 - 5.11 MIL/uL   Hemoglobin 10.5 (L) 12.0 - 15.0 g/dL   HCT 91.4 (L) 78.2 - 95.6 %   MCV 87.2 78.0 - 100.0 fL   MCH 30.6 26.0 - 34.0 pg   MCHC 35.1 30.0 - 36.0 g/dL   RDW 21.3 08.6 - 57.8 %   Platelets 392 150 - 400 K/uL    Imaging Studies:    None pending  Medications:  Scheduled . ceFEPime (MAXIPIME) IV  2 g Intravenous Q8H   And  . metronidazole  500 mg Intravenous Q8H  . docusate sodium  100 mg Oral Daily  . prenatal multivitamin  1 tablet Oral Q1200   I have reviewed the patient's current medications.  ASSESSMENT: Patient Active Problem List   Diagnosis Date Noted  . Appendicitis 12/31/2015  . Supervision of normal first pregnancy 11/10/2015  . Late prenatal care affecting pregnancy in third trimester,  antepartum 11/10/2015  No pregnancy concerns at present  PLAN: Pt encouraged to ambulate today. Cont IV atbx for now. Discharge plan per Gen surg    Continue routine antenatal care.   HARRAWAY-SMITH, Sahaana Weitman 01/03/2016,6:51 AM

## 2016-01-03 NOTE — Progress Notes (Signed)
Patient ID: Kimberly KnudsenCourtney Farley, female   DOB: [birth year], 25 y.o.   MRN: [id number] 3 Days Post-Op  Subjective: Some generalized abd pain and nausea the same as yesterday  Objective: Vital signs in last 24 hours: Temp:  [98 F (36.7 C)-99.4 F (37.4 C)] 98.8 F (37.1 C) (08/26 1020) Pulse Rate:  [97-110] 110 (08/26 0632) Resp:  [15-22] 22 (08/26 1020) BP: (116-126)/(72-77) 119/73 (08/26 [id number])    Intake/Output from previous day: No intake/output data recorded. Intake/Output this shift: No intake/output data recorded.  General appearance: alert, cooperative and no distress GI: Mild diffuse tenderness Incision/Wound: No erythema or drainage  Lab Results:   Recent Labs  01/02/16 0511 01/03/16 0620  WBC 15.2* 14.8*  HGB 10.5* 10.5*  HCT 29.7* 29.9*  PLT 348 392   BMET  Recent Labs  01/02/16 2002  NA 132*  K 3.5  CL 106  CO2 15*  GLUCOSE 71  BUN 5*  CREATININE 0.75  CALCIUM 10.5*     Studies/Results: No results found.  Anti-infectives: Anti-infectives    Start     Dose/Rate Route Frequency Ordered Stop   12/31/15 0500  ceFEPIme (MAXIPIME) 2 g in dextrose 5 % 50 mL IVPB     2 g 100 mL/hr over 30 Minutes Intravenous Every 8 hours 12/31/15 0459     12/31/15 0459  metroNIDAZOLE (FLAGYL) IVPB 500 mg     500 mg 100 mL/hr over 60 Minutes Intravenous Every 8 hours 12/31/15 0459     12/30/15 2330  cefTRIAXone (ROCEPHIN) 1 g in dextrose 5 % 50 mL IVPB     1 g 100 mL/hr over 30 Minutes Intravenous  Once 12/30/15 2328 12/31/15 0032      Assessment/Plan: s/p Procedure(s): APPENDECTOMY LAPAROSCOPIC Perforated appendicitis Still with some pain and tenderness, leukocytosis, though appears stable Would continue IV abx today, CBC in AM, assess for discharge in AM   LOS: 3 days    Vern Prestia T 01/03/2016

## 2016-01-04 DIAGNOSIS — O99613 Diseases of the digestive system complicating pregnancy, third trimester: Principal | ICD-10-CM

## 2016-01-04 DIAGNOSIS — K37 Unspecified appendicitis: Secondary | ICD-10-CM

## 2016-01-04 DIAGNOSIS — Z3A36 36 weeks gestation of pregnancy: Secondary | ICD-10-CM

## 2016-01-04 DIAGNOSIS — O0933 Supervision of pregnancy with insufficient antenatal care, third trimester: Secondary | ICD-10-CM

## 2016-01-04 LAB — TYPE AND SCREEN
ABO/RH(D): O POS
ANTIBODY SCREEN: NEGATIVE
UNIT DIVISION: 0
UNIT DIVISION: 0

## 2016-01-04 LAB — CBC
HCT: 29.5 % — ABNORMAL LOW (ref 36.0–46.0)
HEMOGLOBIN: 10.5 g/dL — AB (ref 12.0–15.0)
MCH: 30.9 pg (ref 26.0–34.0)
MCHC: 35.6 g/dL (ref 30.0–36.0)
MCV: 86.8 fL (ref 78.0–100.0)
PLATELETS: 387 10*3/uL (ref 150–400)
RBC: 3.4 MIL/uL — ABNORMAL LOW (ref 3.87–5.11)
RDW: 13.6 % (ref 11.5–15.5)
WBC: 13.5 10*3/uL — ABNORMAL HIGH (ref 4.0–10.5)

## 2016-01-04 MED ORDER — AMOXICILLIN-POT CLAVULANATE 500-125 MG PO TABS
1.0000 | ORAL_TABLET | Freq: Three times a day (TID) | ORAL | 0 refills | Status: DC
Start: 2016-01-04 — End: 2021-01-16

## 2016-01-04 MED ORDER — HYDROCODONE-ACETAMINOPHEN 5-325 MG PO TABS: 1.0000 | ORAL_TABLET | ORAL | 0 refills | Status: DC | PRN

## 2016-01-04 MED ORDER — DOCUSATE SODIUM 100 MG PO CAPS: 100.0000 mg | ORAL_CAPSULE | Freq: Every day | ORAL | 0 refills | Status: DC

## 2016-01-04 NOTE — Discharge Summary (Signed)
OB Discharge Summary     Patient Name: Kimberly Farley DOB: 1990/07/21 MRN: [id number]  Date of admission: 12/30/2015 Delivering MD: This patient has no babies on file.  Date of discharge: 01/04/2016  Admitting diagnosis: Pain [R52] Acute appendicitis complicating pregnancy [O99.89, K35.89] Intrauterine pregnancy: [redacted]w[redacted]d     Secondary diagnosis:  Active Problems:   Late prenatal care affecting pregnancy in third trimester, antepartum   Appendicitis   Pregnancy, supervision, high-risk s/p lsc appendectomy Additional problems:      Discharge diagnosis: [redacted]w[redacted]d pregnancy s/p laparoscopic appendectomy                                                                                                Hospital course:  Patient admitted with abdominal pain and a ruptured appendic. She underwent a laparoscopic appendectomy without complication. Her postoperative course was uncomplicated. She remained afebrile, she ambulated, tolerated po intake. Fetal status remained reassuring  Physical exam  Vitals:   01/03/16 1853 01/03/16 2159 01/04/16 0558 01/04/16 1050  BP:  130/78 135/90   Pulse:  96 99   Resp:   18 20  Temp: 99 F (37.2 C) 97.6 F (36.4 C) 98.2 F (36.8 C) 98.1 F (36.7 C)  TempSrc: Oral Oral Oral Oral  SpO2:      Weight:      Height:       General: alert, cooperative and no distress Abdomen: gravid, non tender Incision: Healing well with no significant drainage, No significant erythema DVT Evaluation: No evidence of DVT seen on physical exam. Labs: Lab Results  Component Value Date   WBC 13.5 (H) 01/04/2016   HGB 10.5 (L) 01/04/2016   HCT 29.5 (L) 01/04/2016   MCV 86.8 01/04/2016   PLT 387 01/04/2016   CMP Latest Ref Rng & Units 01/02/2016  Glucose 65 - 99 mg/dL 71  BUN 6 - 20 mg/dL 5(L)  Creatinine 4.09 - 1.00 mg/dL 8.11  Sodium 914 - 782 mmol/L 132(L)  Potassium 3.5 - 5.1 mmol/L 3.5  Chloride 101 - 111 mmol/L 106  CO2 22 - 32 mmol/L 15(L)  Calcium 8.9 - 10.3  mg/dL 10.5(H)  Total Protein 6.5 - 8.1 g/dL 7.7  Total Bilirubin 0.3 - 1.2 mg/dL 0.5  Alkaline Phos 38 - 126 U/L 125  AST 15 - 41 U/L 20  ALT 14 - 54 U/L 14    Discharge instruction: per After Visit Summary and "Baby and Me Booklet".  After visit meds:    Medication List    TAKE these medications   amoxicillin-clavulanate 500-125 MG tablet Commonly known as:  AUGMENTIN Take 1 tablet (500 mg total) by mouth 3 (three) times daily.   docusate sodium 100 MG capsule Commonly known as:  COLACE Take 1 capsule (100 mg total) by mouth daily.   HYDROcodone-acetaminophen 5-325 MG tablet Commonly known as:  NORCO/VICODIN Take 1-2 tablets by mouth every 4 (four) hours as needed for moderate pain.   pantoprazole 40 MG tablet Commonly known as:  PROTONIX Take 1 tablet (40 mg total) by mouth daily.   prenatal multivitamin Tabs tablet Take 1 tablet by  mouth daily at 12 noon.       Diet: routine diet  Activity: Advance as tolerated. Pelvic rest for 6 weeks.   Outpatient follow up: an appointment will be made for her to follow up next week Follow up Appt:No future appointments. Follow up Visit:No Follow-up on file.    01/04/2016 Breyanna Valera, MD

## 2016-01-04 NOTE — Progress Notes (Signed)
Patient ID: Kimberly Farley, female   DOB: [birth year], 25 y.o.   MRN: [id number] [facility] ANTEPARTUM COMPREHENSIVE PROGRESS NOTE  Kimberly Farley is a 25 y.o. G1P0 at 6539w6d  who is admitted for post op management after appendectomy in 236 week gravida.   Fetal presentation is cephalic. Length of Stay:  4  Days  Subjective: Pt reports abdominal pain which is improving. She states that she is exhausted secondary to an active fetus. She tolerated a regular diet and has been ambulating  Patient reports good fetal movement.  She reports no uterine contractions, no bleeding and no loss of fluid per vagina.  Vitals:  Blood pressure 135/90, pulse 99, temperature 98.2 F (36.8 C), temperature source Oral, resp. rate 18, height 5\' 1"  (1.549 m), weight 170 lb (77.1 kg), SpO2 95 %. Physical Examination: General appearance - alert, well appearing, and in no distress Chest - clear to auscultation, no wheezes, rales or rhonchi, symmetric air entry Heart - normal rate, regular rhythm, normal S1, S2, no murmurs, rubs, clicks or gallops Abdomen - gravid and appropriately tender postop Cervical Exam: Not evaluated.  Extremities: extremities normal, atraumatic, no cyanosis or edema Membranes:intact  Fetal Monitoring:  Baseline: 130 bpm, Variability: Good {> 6 bpm), Accelerations: Reactive and Decelerations: Absent Toco: irregular contractions  Labs:  Results for orders placed or performed during the hospital encounter of 12/30/15 (from the past 24 hour(s))  CBC   Collection Time: 01/04/16  6:16 AM  Result Value Ref Range   WBC 13.5 (H) 4.0 - 10.5 K/uL   RBC 3.40 (L) 3.87 - 5.11 MIL/uL   Hemoglobin 10.5 (L) 12.0 - 15.0 g/dL   HCT 75.629.5 (L) 43.336.0 - 29.546.0 %   MCV 86.8 78.0 - 100.0 fL   MCH 30.9 26.0 - 34.0 pg   MCHC 35.6 30.0 - 36.0 g/dL   RDW 18.813.6 41.611.5 - 60.615.5 %   Platelets 387 150 - 400 K/uL    Imaging Studies:    None pending  Medications:  Scheduled . ceFEPime (MAXIPIME) IV  2 g Intravenous  Q8H   And  . metronidazole  500 mg Intravenous Q8H  . docusate sodium  100 mg Oral Daily  . prenatal multivitamin  1 tablet Oral Q1200   I have reviewed the patient's current medications.  ASSESSMENT: Patient Active Problem List   Diagnosis Date Noted  . Pregnancy, supervision, high-risk 01/03/2016  . Appendicitis 12/31/2015  . Supervision of normal first pregnancy 11/10/2015  . Late prenatal care affecting pregnancy in third trimester, antepartum 11/10/2015    PLAN: Patient is doing well post-operatively without any obstetrical concerns Cont IV atbx as per gen surgery Discharge plan per Gen surg    Continue routine antenatal care.   Kimberly Farley 01/04/2016,8:46 AM

## 2016-01-04 NOTE — Progress Notes (Signed)
Patient ID: Kimberly Farley, female   DOB: 1991-03-13, 25 y.o.   MRN: [id number] 4 Days Post-Op  Subjective: Pt feeling better today.  Tolerating a diet.  WBC trending down.  Objective: Vital signs in last 24 hours: Temp:  [97.6 F (36.4 C)-99 F (37.2 C)] 98.1 F (36.7 C) (08/27 1050) Pulse Rate:  [74-99] 99 (08/27 0558) Resp:  [18-24] 20 (08/27 1050) BP: (126-135)/(78-90) 135/90 (08/27 0558)    Intake/Output from previous day: No intake/output data recorded. Intake/Output this shift: No intake/output data recorded.  General appearance: alert, cooperative and no distress GI: Mild diffuse tenderness Incision/Wound: No erythema or drainage  Lab Results:   Recent Labs  01/03/16 0620 01/04/16 0616  WBC 14.8* 13.5*  HGB 10.5* 10.5*  HCT 29.9* 29.5*  PLT 392 387   BMET  Recent Labs  01/02/16 2002  NA 132*  K 3.5  CL 106  CO2 15*  GLUCOSE 71  BUN 5*  CREATININE 0.75  CALCIUM 10.5*     Studies/Results: No results found.  Anti-infectives: Anti-infectives    Start     Dose/Rate Route Frequency Ordered Stop   12/31/15 0500  ceFEPIme (MAXIPIME) 2 g in dextrose 5 % 50 mL IVPB     2 g 100 mL/hr over 30 Minutes Intravenous Every 8 hours 12/31/15 0459     12/31/15 0459  metroNIDAZOLE (FLAGYL) IVPB 500 mg     500 mg 100 mL/hr over 60 Minutes Intravenous Every 8 hours 12/31/15 0459     12/30/15 2330  cefTRIAXone (ROCEPHIN) 1 g in dextrose 5 % 50 mL IVPB     1 g 100 mL/hr over 30 Minutes Intravenous  Once 12/30/15 2328 12/31/15 0032      Assessment/Plan: s/p Procedure(s): APPENDECTOMY LAPAROSCOPIC Perforated appendicitis Still with some pain and tenderness, leukocytosis, though appears to be decreasing Ok to switch to PO abx today for a total course of 10 days  Ok to d/c home from our standpoint.    LOS: 4 days    Kimberly Dail C. 01/04/2016

## 2016-01-04 NOTE — Discharge Instructions (Signed)

## 2016-01-05 ENCOUNTER — Encounter (HOSPITAL_COMMUNITY): Payer: Self-pay | Admitting: *Deleted

## 2016-01-05 ENCOUNTER — Inpatient Hospital Stay (HOSPITAL_COMMUNITY)
Admission: AD | Admit: 2016-01-05 | Discharge: 2016-01-07 | DRG: 775 | Disposition: A | Discharge status: Home / Self Care | Payer: Medicaid Other | Source: Ambulatory Visit | Attending: Family Medicine | Admitting: Family Medicine

## 2016-01-05 DIAGNOSIS — Z8249 Family history of ischemic heart disease and other diseases of the circulatory system: Secondary | ICD-10-CM

## 2016-01-05 DIAGNOSIS — O4202 Full-term premature rupture of membranes, onset of labor within 24 hours of rupture: Secondary | ICD-10-CM | POA: Diagnosis not present

## 2016-01-05 DIAGNOSIS — Z3403 Encounter for supervision of normal first pregnancy, third trimester: Secondary | ICD-10-CM | POA: Diagnosis present

## 2016-01-05 DIAGNOSIS — Z3A37 37 weeks gestation of pregnancy: Secondary | ICD-10-CM

## 2016-01-05 DIAGNOSIS — IMO0001 Reserved for inherently not codable concepts without codable children: Secondary | ICD-10-CM

## 2016-01-05 DIAGNOSIS — Z823 Family history of stroke: Secondary | ICD-10-CM | POA: Diagnosis not present

## 2016-01-05 DIAGNOSIS — Z880 Allergy status to penicillin: Secondary | ICD-10-CM

## 2016-01-05 LAB — CBC
HCT: 30.4 % — ABNORMAL LOW (ref 36.0–46.0)
Hemoglobin: 10.9 g/dL — ABNORMAL LOW (ref 12.0–15.0)
MCH: 30.9 pg (ref 26.0–34.0)
MCHC: 35.9 g/dL (ref 30.0–36.0)
MCV: 86.1 fL (ref 78.0–100.0)
Platelets: 442 K/uL — ABNORMAL HIGH (ref 150–400)
RBC: 3.53 MIL/uL — ABNORMAL LOW (ref 3.87–5.11)
RDW: 13.8 % (ref 11.5–15.5)
WBC: 12.4 K/uL — ABNORMAL HIGH (ref 4.0–10.5)

## 2016-01-05 LAB — TYPE AND SCREEN
ABO/RH(D): O POS
Antibody Screen: NEGATIVE

## 2016-01-05 LAB — POCT FERN TEST: POCT Fern Test: NEGATIVE

## 2016-01-05 MED ORDER — OXYTOCIN BOLUS FROM INFUSION: 500.0000 mL | Freq: Once | INTRAVENOUS | Status: DC

## 2016-01-05 MED ORDER — ONDANSETRON HCL 4 MG/2ML IJ SOLN: 4.0000 mg | INTRAMUSCULAR | Status: DC | PRN

## 2016-01-05 MED ORDER — SENNOSIDES-DOCUSATE SODIUM 8.6-50 MG PO TABS
2.0000 | ORAL_TABLET | ORAL | Status: DC
  Administered 2016-01-05: 2 via ORAL
  Filled 2016-01-05 (×2): qty 2

## 2016-01-05 MED ORDER — OXYCODONE-ACETAMINOPHEN 5-325 MG PO TABS
1.0000 | ORAL_TABLET | ORAL | Status: DC | PRN
  Administered 2016-01-05: 1 via ORAL
  Filled 2016-01-05: qty 1

## 2016-01-05 MED ORDER — SOD CITRATE-CITRIC ACID 500-334 MG/5ML PO SOLN: 30.0000 mL | ORAL | Status: DC | PRN

## 2016-01-05 MED ORDER — BENZOCAINE-MENTHOL 20-0.5 % EX AERO
1.0000 "application " | INHALATION_SPRAY | CUTANEOUS | Status: DC | PRN
  Administered 2016-01-05: 1 via TOPICAL
  Filled 2016-01-05: qty 56

## 2016-01-05 MED ORDER — OXYCODONE-ACETAMINOPHEN 5-325 MG PO TABS: 2.0000 | ORAL_TABLET | ORAL | Status: DC | PRN

## 2016-01-05 MED ORDER — DIBUCAINE 1 % RE OINT: 1.0000 "application " | TOPICAL_OINTMENT | RECTAL | Status: DC | PRN

## 2016-01-05 MED ORDER — SENNOSIDES-DOCUSATE SODIUM 8.6-50 MG PO TABS
1.0000 | ORAL_TABLET | Freq: Every day | ORAL | Status: DC
  Filled 2016-01-05 (×2): qty 1

## 2016-01-05 MED ORDER — LACTATED RINGERS IV SOLN: 500.0000 mL | INTRAVENOUS | Status: DC | PRN

## 2016-01-05 MED ORDER — ONDANSETRON HCL 4 MG/2ML IJ SOLN: 4.0000 mg | Freq: Four times a day (QID) | INTRAMUSCULAR | Status: DC | PRN

## 2016-01-05 MED ORDER — WITCH HAZEL-GLYCERIN EX PADS: 1.0000 "application " | MEDICATED_PAD | CUTANEOUS | Status: DC | PRN

## 2016-01-05 MED ORDER — FENTANYL CITRATE (PF) 100 MCG/2ML IJ SOLN: 100.0000 ug | INTRAMUSCULAR | Status: DC | PRN

## 2016-01-05 MED ORDER — FLEET ENEMA 7-19 GM/118ML RE ENEM: 1.0000 | ENEMA | RECTAL | Status: DC | PRN

## 2016-01-05 MED ORDER — TETANUS-DIPHTH-ACELL PERTUSSIS 5-2.5-18.5 LF-MCG/0.5 IM SUSP: 0.5000 mL | Freq: Once | INTRAMUSCULAR | Status: DC

## 2016-01-05 MED ORDER — COCONUT OIL OIL: 1.0000 "application " | TOPICAL_OIL | Status: DC | PRN

## 2016-01-05 MED ORDER — DIPHENHYDRAMINE HCL 25 MG PO CAPS: 25.0000 mg | ORAL_CAPSULE | Freq: Four times a day (QID) | ORAL | Status: DC | PRN

## 2016-01-05 MED ORDER — PRENATAL MULTIVITAMIN CH
1.0000 | ORAL_TABLET | Freq: Every day | ORAL | Status: DC
  Administered 2016-01-06 – 2016-01-07 (×2): 1 via ORAL
  Filled 2016-01-05 (×2): qty 1

## 2016-01-05 MED ORDER — LACTATED RINGERS IV SOLN
INTRAVENOUS | Status: DC
  Administered 2016-01-05 (×2): via INTRAVENOUS

## 2016-01-05 MED ORDER — OXYTOCIN 40 UNITS IN LACTATED RINGERS INFUSION - SIMPLE MED
2.5000 [IU]/h | INTRAVENOUS | Status: DC
  Filled 2016-01-05: qty 1000

## 2016-01-05 MED ORDER — ACETAMINOPHEN 325 MG PO TABS: 650.0000 mg | ORAL_TABLET | ORAL | Status: DC | PRN

## 2016-01-05 MED ORDER — PANTOPRAZOLE SODIUM 40 MG PO TBEC: 40.0000 mg | DELAYED_RELEASE_TABLET | Freq: Every day | ORAL | Status: DC

## 2016-01-05 MED ORDER — ZOLPIDEM TARTRATE 5 MG PO TABS: 5.0000 mg | ORAL_TABLET | Freq: Every evening | ORAL | Status: DC | PRN

## 2016-01-05 MED ORDER — AMOXICILLIN-POT CLAVULANATE 500-125 MG PO TABS
1.0000 | ORAL_TABLET | Freq: Three times a day (TID) | ORAL | Status: DC
  Administered 2016-01-05 – 2016-01-07 (×6): 500 mg via ORAL
  Filled 2016-01-05 (×11): qty 1

## 2016-01-05 MED ORDER — FENTANYL CITRATE (PF) 100 MCG/2ML IJ SOLN
100.0000 ug | INTRAMUSCULAR | Status: DC | PRN
  Administered 2016-01-05 (×2): 100 ug via INTRAVENOUS
  Filled 2016-01-05 (×2): qty 2

## 2016-01-05 MED ORDER — SIMETHICONE 80 MG PO CHEW
80.0000 mg | CHEWABLE_TABLET | ORAL | Status: DC | PRN
Start: 2016-01-05 — End: 2016-01-07

## 2016-01-05 MED ORDER — LIDOCAINE HCL (PF) 1 % IJ SOLN
30.0000 mL | INTRAMUSCULAR | Status: DC | PRN
  Filled 2016-01-05: qty 30

## 2016-01-05 MED ORDER — IBUPROFEN 600 MG PO TABS
600.0000 mg | ORAL_TABLET | Freq: Four times a day (QID) | ORAL | Status: DC
  Administered 2016-01-05 – 2016-01-07 (×8): 600 mg via ORAL
  Filled 2016-01-05 (×8): qty 1

## 2016-01-05 MED ORDER — ONDANSETRON HCL 4 MG PO TABS: 4.0000 mg | ORAL_TABLET | ORAL | Status: DC | PRN

## 2016-01-05 NOTE — Progress Notes (Signed)
Called to evaluate pt for potential SROM, no leaking. Cervical check revealed 9.5/100/0. Pain doing well with nitrous. Continue with current plan. Attempted AROM with no fluid. Continue expectant management.

## 2016-01-05 NOTE — MAU Note (Signed)
Pt DC'd yesterday, was here, had appendectomy @ [facility], [facility], contractions became very strong this a.m.  Had some bloody show & ? LOF @ 0800.

## 2016-01-05 NOTE — H&P (Signed)
LABOR AND DELIVERY ADMISSION HISTORY AND PHYSICAL NOTE  Kimberly Farley is a 25 y.o. female G1P0 with IUP at 3051w0d by  26wk U/S presenting for active labor. Had contractions at home q2-423min starting around 8am this morning, felt "pop" and had big gush of fluids with bloody show. Feels very uncomfortable with contractions, abd pain in b/l lower. She reports positive fetal movement.   Prenatal History/Complications: recent hospital stay for grossly ruptured appendicitis (discharged 01/04/16)  Past Medical History: Past Medical History:  Diagnosis Date  . Medical history non-contributory     Past Surgical History: Past Surgical History:  Procedure Laterality Date  . LAPAROSCOPIC APPENDECTOMY Right 12/31/2015   Procedure: APPENDECTOMY LAPAROSCOPIC;  Surgeon: Ovidio Kinavid Newman, MD;  Location: [facility];  Service: General;  Laterality: Right;  . NO PAST SURGERIES      Obstetrical History: OB History    Gravida Para Term Preterm AB Living   1             SAB TAB Ectopic Multiple Live Births                  Social History: Social History   Social History  . Marital status: Single    Spouse name: N/A  . Number of children: N/A  . Years of education: N/A   Social History Main Topics  . Smoking status: Never Smoker  . Smokeless tobacco: Never Used  . Alcohol use Yes     Comment: socially until pregnancy  . Drug use: No  . Sexual activity: Not Currently    Birth control/ protection: None   Other Topics Concern  . None   Social History Narrative  . None    Family History: Family History  Problem Relation Age of Onset  . Hypertension Mother   . Hypertension Maternal Grandmother   . Stroke Maternal Grandmother   . Hearing loss Maternal Grandmother   . Alcohol abuse Maternal Grandfather     Allergies: Allergies  Allergen Reactions  . Penicillins     Prescriptions Prior to Admission  Medication Sig Dispense Refill Last Dose  . amoxicillin-clavulanate (AUGMENTIN) 500-125  MG tablet Take 1 tablet (500 mg total) by mouth 3 (three) times daily. 21 tablet 0   . docusate sodium (COLACE) 100 MG capsule Take 1 capsule (100 mg total) by mouth daily. 10 capsule 0   . HYDROcodone-acetaminophen (NORCO/VICODIN) 5-325 MG tablet Take 1-2 tablets by mouth every 4 (four) hours as needed for moderate pain. 30 tablet 0   . pantoprazole (PROTONIX) 40 MG tablet Take 1 tablet (40 mg total) by mouth daily. 30 tablet 2 Past Month at Unknown time  . Prenatal Vit-Fe Fumarate-FA (PRENATAL MULTIVITAMIN) TABS tablet Take 1 tablet by mouth daily at 12 noon.   12/30/2015 at Unknown time     Review of Systems   All systems reviewed and negative except as stated in HPI  Blood pressure 129/93, pulse 102, temperature 98 F (36.7 C), temperature source Oral, resp. rate 18. General appearance: alert, cooperative and moderate distress Lungs: no respiratory distress Heart: regular rate Abdomen: TTP diffusely Extremities: No calf swelling or tenderness Presentation: cephalic by cervical exam in MAU by RN exam Fetal monitoring: 130 baseline, moderate variability, + accelerations, no decels Uterine activity: q765min Dilation: 3.5 Effacement (%): 90 Station: -1 Exam by:: Dorrene GermanJ. Lowe RN Neg fern test by Dr Genevie AnnSchenk  Prenatal labs: ABO, Rh: --/--/O POS, O POS (08/23 0102) Antibody: NEG (08/23 0102) Rubella: immune RPR: Non Reactive (08/22 1915)  HBsAg:   Neg HIV:   Nonreactive GBS: Negative (08/16 0000)  Genetic screening:  declined Anatomy US: normal  Prenatal Transfer Tool  Maternal Diabetes: No Genetic Screening: Declined Maternal Ultrasounds/Referrals: Normal Fetal Ultrasounds or other Referrals:  None Maternal Substance Abuse:  No Significant Maternal Medications:  Meds include: Other: Augmentin for recent grossly ruptured appendicitis Significant Maternal Lab Results: Lab values include: Group B Strep negative  No results found for this or any previous visit (from the past 24  hour(s)).  Patient Active Problem List   Diagnosis Date Noted  . Pregnancy, supervision, high-risk 01/03/2016  . Appendicitis 12/31/2015  . Supervision of normal first pregnancy 11/10/2015  . Late prenatal care affecting pregnancy in third trimester, antepartum 11/10/2015    Assessment: Kimberly Farley is a 25 y.o. G1P0 at [redacted]w[redacted]d here for active labor  #Labor:expectant management #Pain: Fentanyl and nitrous oxide #FWB: Category I #ID:  GBS neg #MOF: breast #MOC:IUD #Circ:  outpatient  Kimberly Her, DO PGY-1 8/28/20179:49 AM   OB FELLOW HISTORY AND PHYSICAL ATTESTATION  I have seen and examined this patient; I agree with above documentation in the resident's note.    Kimberly Farley 01/05/2016, 11:34 AM

## 2016-01-05 NOTE — Anesthesia Pain Management Evaluation Note (Signed)
  CRNA Pain Management Visit Note  Patient: Smitty KnudsenCourtney Gaddy, 25 y.o., female  "Hello I am a [facility] the [facility]. We have an anesthesia team available at all times to provide care throughout the hospital, including epidural management and anesthesia for C-section. I don't know your plan for the delivery whether it a natural birth, water birth, IV sedation, nitrous supplementation, doula or epidural, but we want to meet your pain goals."   1.Was your pain managed to your expectations on prior hospitalizations?   No prior hospitalizations  2.What is your expectation for pain management during this hospitalization?     Nitrous Oxide  3.How can we help you reach that goal? Be available if needed  Record the patient's initial score and the patient's pain goal.   Pain: 10  Pain Goal: 10 [facility] wants you to be able to say your pain was always managed very well.  Madison HickmanGREGORY,Kinan Safley 01/05/2016

## 2016-01-06 LAB — RPR: RPR Ser Ql: NONREACTIVE

## 2016-01-06 NOTE — Progress Notes (Signed)
LCSW consulted for late prenatal care at 26 weeks and following drug cord.  LCSW will screen out consult for the following:    1. MOB had prescribed medication for pain control documented on DC summary from her recent surgery. 2. MOB met hospital policy for prenatal visits and under 28 weeks and 1 day (she had 3 visits, one in [place]). 3. Baby did test positive for opiates, however MOB had prescription.  LCSW will follow cord to check for any substances not prescribed and make appropriate report if necessary.  If needs arise or MOB requests visit please call and LCSW will visit MOB.  Kimberly Laidlaw LCSW, MSW Clinical Social Work: System Wide Float Coverage for [facility] N[facility] Clinical social worker [phone]  

## 2016-01-06 NOTE — Progress Notes (Signed)
Post Partum Day 1 Subjective: up ad lib, voiding, tolerating PO, + flatus and abdominal pain that is improved from yesterday  Objective: Blood pressure 124/86, pulse 95, temperature 98.9 F (37.2 C), temperature source Oral, resp. rate 18, height 5\' 1"  (1.549 m), weight 77.1 kg (170 lb), SpO2 97 %, unknown if currently breastfeeding.  Physical Exam:  General: alert, cooperative and no distress Lochia: appropriate Uterine Fundus: firm DVT Evaluation: No evidence of DVT seen on physical exam. Negative Homan's sign.   Recent Labs  01/04/16 0616 01/05/16 0918  HGB 10.5* 10.9*  HCT 29.5* 30.4*    Assessment/Plan: Plan for discharge tomorrow  Continue Augmentin for recent ruptured appendicitis (surgery on 12/31/15) for a total of 10 days finish 9/2   LOS: 1 day   Kimberly Farley PGY-1 01/06/2016, 9:22 AM   OB FELLOW DISCHARGE ATTESTATION  I have seen and examined this patient and agree with above documentation in the resident's note.   Ernestina PennaNicholas Jasson Siegmann, MD 9:32 AM

## 2016-01-06 NOTE — Lactation Note (Addendum)
This note was copied from a baby's chart. Lactation Consultation Note: Initial visit [name] and <6 lbs- now 15 hours old. Mom reports he is biting at the breast, not really sucking. Has been finger feeding formula.with a 5 F feeding tube and syringe. Reports he is learning to suck by using that, RN is just setting up DEBP for mom. Assisted mom with pump- reviewed setup, use and cleaning of pump pieces. Reviewed hand expression with mom. Mom pumped for 15 min-none obtained   Encouragement given. BF brochure given. Reviewed our phone number, OP appointments and BFSG as resources for support after DC. Mom sleepy and wants to order lunch.  Encouraged to call for assist at next feeding  Patient Name: Kimberly Smitty KnudsenCourtney Farley ZOXWR'UToday's Date: 01/06/2016 Reason for consult: Initial assessment;Infant < 6lbs;Late preterm infant   Maternal Data Formula Feeding for Exclusion: No Has patient been taught Hand Expression?: Yes Does the patient have breastfeeding experience prior to this delivery?: No  Feeding    LATCH Score/Interventions                      Lactation Tools Discussed/Used WIC Program: Yes Pump Review: Setup, frequency, and cleaning Initiated by:: RN Date initiated:: 01/06/16   Consult Status Consult Status: Follow-up Date: 01/07/16 Follow-up type: In-patient    Pamelia HoitWeeks, Eartha Vonbehren D 01/06/2016, 11:19 AM

## 2016-01-06 NOTE — Progress Notes (Signed)
Assisted pt to bathroom, gait steady. Peri care taught. Pt voided without difficulty. Gait steady. Pt tolerated ambulation well.

## 2016-01-07 ENCOUNTER — Ambulatory Visit: Payer: Self-pay

## 2016-01-07 MED ORDER — OXYCODONE HCL 5 MG PO TABS
5.0000 mg | ORAL_TABLET | ORAL | Status: DC | PRN
  Administered 2016-01-07 (×2): 5 mg via ORAL
  Filled 2016-01-07 (×2): qty 1

## 2016-01-07 MED ORDER — IBUPROFEN 600 MG PO TABS
600.0000 mg | ORAL_TABLET | Freq: Four times a day (QID) | ORAL | 0 refills | Status: DC
Start: 2016-01-07 — End: 2021-01-16

## 2016-01-07 NOTE — Discharge Instructions (Signed)
Please continue to take your antibiotics augmentin for the full course of 10 days (last dose should be on 9/2).  Vaginal Delivery, Care After Refer to this sheet in the next few weeks. These discharge instructions provide you with information on caring for yourself after delivery. Your health care provider may also give you specific instructions. Your treatment has been planned according to the most current medical practices available, but problems sometimes occur. Call your health care provider if you have any problems or questions after you go home. HOME CARE INSTRUCTIONS  Take over-the-counter or prescription medicines only as directed by your health care provider or pharmacist.  Do not drink alcohol, especially if you are breastfeeding or taking medicine to relieve pain.  Do not chew or smoke tobacco.  Do not use illegal drugs.  Continue to use good perineal care. Good perineal care includes:  Wiping your perineum from front to back.  Keeping your perineum clean.  Do not use tampons or douche until your health care provider says it is okay.  Shower, wash your hair, and take tub baths as directed by your health care provider.  Wear a well-fitting bra that provides breast support.  Eat healthy foods.  Drink enough fluids to keep your urine clear or pale yellow.  Eat high-fiber foods such as whole grain cereals and breads, brown rice, beans, and fresh fruits and vegetables every day. These foods may help prevent or relieve constipation.  Follow your health care provider's recommendations regarding resumption of activities such as climbing stairs, driving, lifting, exercising, or traveling.  Talk to your health care provider about resuming sexual activities. Resumption of sexual activities is dependent upon your risk of infection, your rate of healing, and your comfort and desire to resume sexual activity.  Try to have someone help you with your household activities and your  newborn for at least a few days after you leave the hospital.  Rest as much as possible. Try to rest or take a nap when your newborn is sleeping.  Increase your activities gradually.  Keep all of your scheduled postpartum appointments. It is very important to keep your scheduled follow-up appointments. At these appointments, your health care provider will be checking to make sure that you are healing physically and emotionally. SEEK MEDICAL CARE IF:   You are passing large clots from your vagina. Save any clots to show your health care provider.  You have a foul smelling discharge from your vagina.  You have trouble urinating.  You are urinating frequently.  You have pain when you urinate.  You have a change in your bowel movements.  You have increasing redness, pain, or swelling near your vaginal incision (episiotomy) or vaginal tear.  You have pus draining from your episiotomy or vaginal tear.  Your episiotomy or vaginal tear is separating.  You have painful, hard, or reddened breasts.  You have a severe headache.  You have blurred vision or see spots.  You feel sad or depressed.  You have thoughts of hurting yourself or your newborn.  You have questions about your care, the care of your newborn, or medicines.  You are dizzy or light-headed.  You have a rash.  You have nausea or vomiting.  You were breastfeeding and have not had a menstrual period within 12 weeks after you stopped breastfeeding.  You are not breastfeeding and have not had a menstrual period by the 12th week after delivery.  You have a fever. SEEK IMMEDIATE MEDICAL CARE IF:  You have persistent pain.  You have chest pain.  You have shortness of breath.  You faint.  You have leg pain.  You have stomach pain.  Your vaginal bleeding saturates two or more sanitary pads in 1 hour.   This information is not intended to replace advice given to you by your health care provider. Make sure  you discuss any questions you have with your health care provider.   Document Released: 04/23/2000 Document Revised: 01/15/2015 Document Reviewed: 12/22/2011 Elsevier Interactive Patient Education [employer]2016 Elsevier Inc.

## 2016-01-07 NOTE — Lactation Note (Signed)
This note was copied from a baby's chart. Lactation Consultation Note  Patient Name: Kimberly Smitty KnudsenCourtney Farley ZOXWR'UToday's Date: 01/07/2016 Reason for consult: Follow-up assessment;Infant < 6lbs;Other (Comment) (early term) Baby recently BF and had supplement but giving feeding ques, Attempted to latch baby, assisted Mom with positioning and using breast compression to help baby obtain good depth. Baby having some difficulty at this feeding sustaining good depth, becomes sleepy at breast. Parents have been supplementing using curved tipped syringe at breast. Reviewed supplemental guidelines per LPT policy since baby 37.0 weeks and recommend minimum of 10 ml with each feeding. Encouraged Mom to be post pumping every 3 hours. LC left phone number for Mom to call with next feeding when baby more awake, however baby is having circ today. [facility]. [facility]   Maternal Data    Feeding Feeding Type: Breast Fed Length of feed: 2 min  LATCH Score/Interventions Latch: Repeated attempts needed to sustain latch, nipple held in mouth throughout feeding, stimulation needed to elicit sucking reflex. Intervention(s): Adjust position;Assist with latch;Breast massage;Breast compression  Audible Swallowing: None  Type of Nipple: Everted at rest and after stimulation (short nipple shafts bilateral)  Comfort (Breast/Nipple): Soft / non-tender     Hold (Positioning): Assistance needed to correctly position infant at breast and maintain latch.  LATCH Score: 6  Lactation Tools Discussed/Used Tools: Pump;48F feeding tube / Syringe Breast pump type: Double-Electric Breast Pump   Consult Status Consult Status: Follow-up Date: 01/07/16 Follow-up type: In-patient    Kimberly Farley, [name] 01/07/2016, 10:49 AM

## 2016-01-07 NOTE — Discharge Summary (Signed)
OB Discharge Summary     Patient Name: Kimberly Farley DOB: 1990/11/27 MRN: [id number]  Date of admission: 01/05/2016 Delivering MD: [name] MICHAEL   Date of discharge: 01/07/2016  Admitting diagnosis: 38WKS,LABOR Intrauterine pregnancy: [redacted]w[redacted]d     Secondary diagnosis:  Active Problems:   Active labor   SVD (spontaneous vaginal delivery)  Additional problems: recent hospital stay for grossly ruptured appendicitis (discharged 01/04/16)     Discharge diagnosis: Term Pregnancy Delivered                                                                                                Post partum procedures:none  Augmentation: none  Complications: None  Hospital course:  Onset of Labor With Vaginal Delivery     25 y.o. yo G1P1001 at [redacted]w[redacted]d was admitted in Active Labor on 01/05/2016. Patient had an uncomplicated labor course as follows:  Membrane Rupture Time/Date: 4:14 PM ,01/05/2016   Intrapartum Procedures: Episiotomy: None [1]                                         Lacerations:  Periurethral [8]  Patient had a delivery of a Viable infant. 01/05/2016  Information for the patient's newborn:  Nixie, Laube [[id number]]  Delivery Method: Vag-Spont   She is S/p laparoscopic appendectomy and was continued on her postoperative antibiotics while admitted. She she continue Augmentin until 01/10/2016.    Pateint had an uncomplicated postpartum course.  She is ambulating, tolerating a regular diet, passing flatus, and urinating well. Patient is discharged home in stable condition on 01/07/16.    Physical exam Vitals:   01/06/16 0230 01/06/16 1038 01/06/16 1700 01/07/16 0642  BP: 124/86 125/82 121/74 130/82  Pulse: 95 (!) 101 96 90  Resp: 18  18 19   Temp: 98.9 F (37.2 C) 98.4 F (36.9 C) 98.3 F (36.8 C) 98.2 F (36.8 C)  TempSrc: Oral Oral Oral Oral  SpO2:      Weight:      Height:       General: alert, cooperative and no distress Lochia: appropriate Uterine  Fundus: firm DVT Evaluation: No evidence of DVT seen on physical exam. Negative Homan's sign. Labs: Lab Results  Component Value Date   WBC 12.4 (H) 01/05/2016   HGB 10.9 (L) 01/05/2016   HCT 30.4 (L) 01/05/2016   MCV 86.1 01/05/2016   PLT 442 (H) 01/05/2016   CMP Latest Ref Rng & Units 01/02/2016  Glucose 65 - 99 mg/dL 71  BUN 6 - 20 mg/dL 5(L)  Creatinine 6.29 - 1.00 mg/dL 5.28  Sodium 413 - 244 mmol/L 132(L)  Potassium 3.5 - 5.1 mmol/L 3.5  Chloride 101 - 111 mmol/L 106  CO2 22 - 32 mmol/L 15(L)  Calcium 8.9 - 10.3 mg/dL 10.5(H)  Total Protein 6.5 - 8.1 g/dL 7.7  Total Bilirubin 0.3 - 1.2 mg/dL 0.5  Alkaline Phos 38 - 126 U/L 125  AST 15 - 41 U/L 20  ALT 14 - 54 U/L 14  Discharge instruction: per After Visit Summary and "Baby and Me Booklet".  After visit meds:    Medication List    TAKE these medications   amoxicillin-clavulanate 500-125 MG tablet Commonly known as:  AUGMENTIN Take 1 tablet (500 mg total) by mouth 3 (three) times daily.   docusate sodium 100 MG capsule Commonly known as:  COLACE Take 1 capsule (100 mg total) by mouth daily.   HYDROcodone-acetaminophen 5-325 MG tablet Commonly known as:  NORCO/VICODIN Take 1-2 tablets by mouth every 4 (four) hours as needed for moderate pain.   ibuprofen 600 MG tablet Commonly known as:  ADVIL,MOTRIN Take 1 tablet (600 mg total) by mouth every 6 (six) hours.   pantoprazole 40 MG tablet Commonly known as:  PROTONIX Take 1 tablet (40 mg total) by mouth daily.   prenatal multivitamin Tabs tablet Take 1 tablet by mouth daily at 12 noon.       Diet: routine diet  Activity: Advance as tolerated. Pelvic rest for 6 weeks.   Outpatient follow up:6 weeks Follow up Appt:No future appointments. Follow up Visit: Follow-up Information    [facility] Schedule an appointment as soon as possible for a visit in 6 week(s).   Specialty:  Obstetrics and Gynecology Why:  postpartum  visit Contact information: [address] [place] [id number] [phone]       [facility]   Why:  as needed for potential emergencies Contact information: [address] [place] [phone]          Postpartum contraception: IUD Mirena  Newborn Data: Live born female  Birth Weight: 5 lb 5.9 oz (2435 g) APGAR: 8, 9  Baby Feeding: Breast Disposition:home with mother   01/07/2016 Leland HerElsia J Yoo, DO PGY-1  OB FELLOW DISCHARGE ATTESTATION  I have seen and examined this patient and agree with above documentation in the resident's note.   Ernestina PennaNicholas Gwenivere Hiraldo, MD 8:56 AM

## 2016-01-07 NOTE — Lactation Note (Signed)
This note was copied from a baby's chart. Lactation Consultation Note  Patient Name: Kimberly Smitty KnudsenCourtney Farley ZOXWR'UToday's Date: 01/07/2016 Reason for consult: Follow-up assessment;Difficult latch;Infant < 6lbs;Other (Comment) (Early term baby) Mom called for assist with latch. Mom trying to latch baby but he is not obtaining good depth and Mom does not always realize when baby is not on the nipple well.  Baby giving feeding ques, but becomes sleepy once at breast. Initiated today 16 nipple shield, Mom needs help putting nipple shield on correctly. After few attempts baby did latch and sustain the latch using the nipple shield. Scant amount of colostrum present. Demonstrated to Mom how to supplement at breast using 5 fr feeding tube/syringe. However after observing feeding, advised Mom to consider using bottle to supplement to help baby be more efficient with feedings and protect his calories with feedings. Demonstrated how to use 5 fr feeding tube/syringe to finger feed, Mom did not seem very receptive to using a bottle but will consider.  Feeding plan discussed: BF with each feeding with feeding ques, 8-12 times or more in 24 hours. Advised to keep baby at breast 15-20 minutes, 1 breast each feeding, alternating each feeding. Supplement each feeding - increasing to 20 ml this evening to follow LPT guidelines.  Mom to post pump for 15 minutes. Mom agreeable to plan. Encouraged to call for assist as needed.  Maternal Data    Feeding Feeding Type: Formula Length of feed: 10 min  LATCH Score/Interventions Latch: Repeated attempts needed to sustain latch, nipple held in mouth throughout feeding, stimulation needed to elicit sucking reflex. Intervention(s): Adjust position;Assist with latch;Breast massage;Breast compression  Audible Swallowing: A few with stimulation  Type of Nipple: Flat (become more erect w/stimulation) Intervention(s): Double electric pump  Comfort (Breast/Nipple): Soft / non-tender      Hold (Positioning): Assistance needed to correctly position infant at breast and maintain latch. Intervention(s): Breastfeeding basics reviewed;Support Pillows;Position options;Skin to skin  LATCH Score: 6  Lactation Tools Discussed/Used Tools: Nipple Shields;Pump;36F feeding tube / Syringe Nipple shield size: 16;20 Breast pump type: Double-Electric Breast Pump   Consult Status Consult Status: Follow-up Date: 01/08/16 Follow-up type: In-patient    Kimberly LevinsGranger, Kimberly Farley Ann 01/07/2016, 5:05 PM

## 2016-01-07 NOTE — Lactation Note (Signed)
This note was copied from a baby's chart. Lactation Consultation Note  Patient Name: Boy Smitty KnudsenCourtney Gaddy ZOXWR'UToday's Date: 01/07/2016 Reason for consult: Follow-up assessment;Difficult latch;Infant < 6lbs;Other (Comment) (Early term) Baby recently had supplement but giving feeding ques. Attempted to latch but baby would not sustain latch, could not obtain good depth. Applied #16 nipple shield and after few attempts baby took few suckles, demonstrated how to use 5 fr feeding tube/syringe at breast to supplement. Baby fell asleep and did not take supplement this visit. Mom to call with next feeding for LC to assist, work with nipple shield and work with plan for supplementing.   Maternal Data    Feeding Feeding Type: Breast Fed Length of feed: 0 min  LATCH Score/Interventions Latch: Repeated attempts needed to sustain latch, nipple held in mouth throughout feeding, stimulation needed to elicit sucking reflex. Intervention(s): Adjust position;Assist with latch;Breast massage  Audible Swallowing: None  Type of Nipple: Flat  Comfort (Breast/Nipple): Soft / non-tender     Hold (Positioning): Assistance needed to correctly position infant at breast and maintain latch.  LATCH Score: 5  Lactation Tools Discussed/Used Tools: Nipple Shields;Pump;82F feeding tube / Syringe Nipple shield size: 20;16 Breast pump type: Double-Electric Breast Pump   Consult Status Consult Status: Follow-up Date: 01/07/16 Follow-up type: In-patient    Alfred LevinsGranger, Beverlyn Mcginness Ann 01/07/2016, 1:48 PM

## 2016-01-08 ENCOUNTER — Ambulatory Visit: Payer: Self-pay

## 2016-01-08 NOTE — Lactation Note (Signed)
This note was copied from a baby's chart. Lactation Consultation Note  Patient Name: Boy Smitty KnudsenCourtney Gaddy QVZDG'LToday's Date: 01/08/2016 Reason for consult: Follow-up assessment;Infant < 6lbs;Other (Comment) (early term baby 37.0 weeks.) Mom called LC for assist with latch and giving baby 1st bottle. [facility] but [facility] not sustaining any depth and sleepy. Offered to assist Mom with nipple shield but Mom declined to use nipple shield. Worked with positioning to help baby sustain depth but Mom most comfortable in cross cradle.  Assisted Mom with giving 1st supplement via bottle. Mom had been finger feeding using curved tipped syringe.  Baby had a lot of difficulty developing a suckling pattern with the bottle nipple. Lots of stimulation needed. Once he did develop his suckling pattern he demonstrated a good rhythm. Baby transferred 7 ml but would not take more, [facility] recently had supplement prior to this visit.  Plan for d/c home - since baby not sustaining latch well, advised Mom to BF for 15-20 minutes 1 breast per feeding, alternating breast each feeding, then supplement via bottle - encouraged Dr. Manson PasseyBrown #1 bottle - then post pump for 15 minutes to encourage milk production and to have EBM to supplement. Encouraged to limit time at breast to conserve calorie usage with feedings till baby sustaining latch better. [facility], will advise.  Engorgement care reviewed if needed. OP f/u with lactation scheduled for Wednesday, 01/14/16 at 4:00 pm. Advised RN to observe/assist with next bottle feeding to be sure Mom able to be independent with feeding and baby taking bottle well.   Maternal Data    Feeding Feeding Type: Formula Nipple Type: Slow - flow Length of feed: 7 min  LATCH Score/Interventions Latch: Repeated attempts needed to sustain latch, nipple held in mouth throughout feeding, stimulation needed to elicit sucking  reflex. Intervention(s): Adjust position;Assist with latch;Breast massage;Breast compression  Audible Swallowing: None  Type of Nipple: Everted at rest and after stimulation (with stimulation, nipples evert well) Intervention(s): Hand pump;Double electric pump  Comfort (Breast/Nipple): Soft / non-tender     Hold (Positioning): Assistance needed to correctly position infant at breast and maintain latch. Intervention(s): Breastfeeding basics reviewed;Support Pillows;Position options;Skin to skin  LATCH Score: 6  Lactation Tools Discussed/Used Tools: Pump;Nipple Shields;35F feeding tube / Syringe Nipple shield size: 16;20 Breast pump type: Double-Electric Breast Pump   Consult Status Consult Status: Follow-up Date: 01/08/16 Follow-up type: In-patient    Alfred LevinsGranger, Tilly Pernice Ann 01/08/2016, 3:16 PM

## 2016-01-10 ENCOUNTER — Encounter: Payer: Self-pay | Admitting: *Deleted

## 2016-01-14 ENCOUNTER — Ambulatory Visit (HOSPITAL_COMMUNITY): Admission: RE | Admit: 2016-01-14 | Payer: Medicaid Other | Source: Ambulatory Visit

## 2016-02-05 ENCOUNTER — Ambulatory Visit: Payer: Medicaid Other | Admitting: Family Medicine

## 2016-03-10 ENCOUNTER — Encounter: Payer: Self-pay | Admitting: Obstetrics & Gynecology

## 2016-03-15 ENCOUNTER — Encounter: Payer: Self-pay | Admitting: Obstetrics & Gynecology

## 2016-04-06 ENCOUNTER — Ambulatory Visit: Payer: Medicaid Other | Admitting: Student

## 2016-04-28 ENCOUNTER — Encounter: Payer: Medicaid Other | Admitting: Obstetrics & Gynecology

## 2016-05-17 ENCOUNTER — Ambulatory Visit: Payer: Self-pay | Admitting: Advanced Practice Midwife

## 2016-07-15 NOTE — Progress Notes (Signed)
This encounter was created in error - please disregard.

## 2017-08-29 ENCOUNTER — Encounter: Payer: Self-pay | Admitting: Obstetrics & Gynecology

## 2019-09-20 ENCOUNTER — Ambulatory Visit: Payer: Medicaid Other | Attending: Internal Medicine

## 2019-09-20 DIAGNOSIS — Z23 Encounter for immunization: Secondary | ICD-10-CM

## 2019-09-20 NOTE — Progress Notes (Signed)
   Covid-19 Vaccination Clinic  Name:  Kimberly Farley    MRN: [id number] DOB: 09-24-1990  09/20/2019  [name] was observed post Covid-19 immunization for 15 minutes without incident. She was provided with Vaccine Information Sheet and instruction to access the V-Safe system.   [name] was instructed to call 911 with any severe reactions post vaccine: Marland Kitchen Difficulty breathing  . Swelling of face and throat  . A fast heartbeat  . A bad rash all over body  . Dizziness and weakness   Immunizations Administered    Name Date Dose VIS Date Route   Pfizer COVID-19 Vaccine 09/20/2019  2:55 PM 0.3 mL 07/04/2018 Intramuscular   Manufacturer: [employer], [employer]   Lot: [id number]   NDC: [address]-2

## 2019-10-09 ENCOUNTER — Ambulatory Visit: Payer: Medicaid Other

## 2019-10-15 ENCOUNTER — Ambulatory Visit: Payer: Medicaid Other | Attending: Internal Medicine

## 2019-10-15 DIAGNOSIS — Z23 Encounter for immunization: Secondary | ICD-10-CM

## 2019-10-15 NOTE — Progress Notes (Signed)
   Covid-19 Vaccination Clinic  Name:  Kimberly Farley    MRN: [id number] DOB: 1990/08/12  10/15/2019  [name] was observed post Covid-19 immunization for 15 minutes without incident. She was provided with Vaccine Information Sheet and instruction to access the V-Safe system.   [name] was instructed to call 911 with any severe reactions post vaccine: Marland Kitchen Difficulty breathing  . Swelling of face and throat  . A fast heartbeat  . A bad rash all over body  . Dizziness and weakness   Immunizations Administered    Name Date Dose VIS Date Route   Pfizer COVID-19 Vaccine 10/15/2019 10:47 AM 0.3 mL 07/04/2018 Intramuscular   Manufacturer: [employer], [employer]   Lot: [id number]   NDC: [address]-4

## 2020-05-30 ENCOUNTER — Ambulatory Visit: Payer: Self-pay | Admitting: Nurse Practitioner

## 2020-06-02 ENCOUNTER — Ambulatory Visit: Payer: Medicaid Other | Admitting: Medical-Surgical

## 2020-06-03 ENCOUNTER — Ambulatory Visit: Payer: Medicaid Other | Admitting: Medical-Surgical

## 2020-06-04 ENCOUNTER — Ambulatory Visit: Payer: Medicaid Other | Admitting: Family Medicine

## 2020-06-25 ENCOUNTER — Ambulatory Visit: Payer: Self-pay | Admitting: Nurse Practitioner

## 2021-01-16 ENCOUNTER — Ambulatory Visit (INDEPENDENT_AMBULATORY_CARE_PROVIDER_SITE_OTHER): Payer: Medicaid Other

## 2021-01-16 ENCOUNTER — Inpatient Hospital Stay (HOSPITAL_COMMUNITY)
Admission: AD | Admit: 2021-01-16 | Discharge: 2021-01-16 | Disposition: A | Discharge status: Home / Self Care | Payer: Medicaid Other | Attending: Obstetrics and Gynecology | Admitting: Obstetrics and Gynecology

## 2021-01-16 ENCOUNTER — Other Ambulatory Visit: Payer: Self-pay

## 2021-01-16 DIAGNOSIS — Z049 Encounter for examination and observation for unspecified reason: Secondary | ICD-10-CM | POA: Insufficient documentation

## 2021-01-16 DIAGNOSIS — Z3200 Encounter for pregnancy test, result unknown: Secondary | ICD-10-CM

## 2021-01-16 DIAGNOSIS — O161 Unspecified maternal hypertension, first trimester: Secondary | ICD-10-CM

## 2021-01-16 DIAGNOSIS — Z3201 Encounter for pregnancy test, result positive: Secondary | ICD-10-CM | POA: Diagnosis not present

## 2021-01-16 DIAGNOSIS — O10919 Unspecified pre-existing hypertension complicating pregnancy, unspecified trimester: Secondary | ICD-10-CM

## 2021-01-16 LAB — POCT PREGNANCY, URINE: Preg Test, Ur: POSITIVE — AB

## 2021-01-16 MED ORDER — LABETALOL HCL 200 MG PO TABS: 200.0000 mg | ORAL_TABLET | Freq: Two times a day (BID) | ORAL | 1 refills | Status: DC

## 2021-01-16 NOTE — MAU Note (Signed)
2 +HPT yesterday.  Here to confirm pregnancy.  Denies any problems.

## 2021-01-16 NOTE — Discharge Instructions (Signed)

## 2021-01-16 NOTE — Progress Notes (Signed)
Possible Pregnancy  Here today for pregnancy confirmation. UPT in office today is positive. Called pt; VM left stating I am calling with results. Callback number given. MyChart message sent.   Marjo Bicker, RN 01/16/2021  10:28 AM

## 2021-01-16 NOTE — MAU Provider Note (Signed)
Event Date/Time   First Provider Initiated Contact with Patient 01/16/21 0850     [name] is a 30 y.o. G1P1001 patient who presents to MAU today with complaint of possible pregnancy. She reports 2 positive HPT and is requesting a verification letter today.   O BP (!) 160/111 (BP Location: Right Arm)   Pulse (!) 113   Temp 98.2 F (36.8 C) (Oral)   Resp 18   Ht 5\' 2"  (1.575 m)   Wt 87.2 kg   LMP 12/17/2020   SpO2 99%   BMI 35.17 kg/m  Physical Exam Vitals and nursing note reviewed.  Constitutional:      General: She is not in acute distress.    Appearance: She is well-developed.  HENT:     Head: Normocephalic.  Eyes:     Pupils: Pupils are equal, round, and reactive to light.  Cardiovascular:     Rate and Rhythm: Normal rate and regular rhythm.     Heart sounds: Normal heart sounds.  Pulmonary:     Effort: Pulmonary effort is normal. No respiratory distress.     Breath sounds: Normal breath sounds.  Abdominal:     General: Bowel sounds are normal. There is no distension.     Palpations: Abdomen is soft.     Tenderness: There is no abdominal tenderness.  Skin:    General: Skin is warm and dry.  Neurological:     Mental Status: She is alert and oriented to person, place, and time.  Psychiatric:        Mood and Affect: Mood normal.        Behavior: Behavior normal.        Thought Content: Thought content normal.        Judgment: Judgment normal.    A Medical screening exam complete 1. Possible pregnancy   2. Hypertension affecting pregnancy in first trimester    -Patient with elevated BPs. Denies any hx. Denies any headache, visual changes, chest pain or shortness of breath. Will start PO medication -Discussed with patient that verification of pregnancy is not done in MAU and instructed patient to go to [facility] for verification.    P Discharge from MAU in stable condition List of options for follow-up given  Warning signs for worsening condition  that would warrant emergency follow-up discussed Patient may return to MAU as needed   [facility] Rolm Bookbinder 01/16/2021 8:51 AM

## 2021-01-30 NOTE — Progress Notes (Signed)
Patient was assessed and managed by nursing staff during this encounter. I have reviewed the chart and agree with the documentation and plan. I have also made any necessary editorial changes.  [place] Bing, MD 01/30/2021 10:21 AM

## 2021-03-10 ENCOUNTER — Telehealth (INDEPENDENT_AMBULATORY_CARE_PROVIDER_SITE_OTHER): Payer: Medicaid Other

## 2021-03-10 DIAGNOSIS — R110 Nausea: Secondary | ICD-10-CM

## 2021-03-10 DIAGNOSIS — O0990 Supervision of high risk pregnancy, unspecified, unspecified trimester: Secondary | ICD-10-CM | POA: Insufficient documentation

## 2021-03-10 DIAGNOSIS — Z3A00 Weeks of gestation of pregnancy not specified: Secondary | ICD-10-CM

## 2021-03-10 DIAGNOSIS — O169 Unspecified maternal hypertension, unspecified trimester: Secondary | ICD-10-CM

## 2021-03-10 DIAGNOSIS — I10 Essential (primary) hypertension: Secondary | ICD-10-CM

## 2021-03-10 MED ORDER — PRENATAL PLUS 27-1 MG PO TABS: 1.0000 | ORAL_TABLET | Freq: Every day | ORAL | 11 refills | Status: DC

## 2021-03-10 MED ORDER — PROMETHAZINE HCL 25 MG PO TABS: 25.0000 mg | ORAL_TABLET | Freq: Four times a day (QID) | ORAL | 1 refills | Status: DC | PRN

## 2021-03-10 MED ORDER — LABETALOL HCL 200 MG PO TABS: 200.0000 mg | ORAL_TABLET | Freq: Two times a day (BID) | ORAL | 3 refills | Status: DC

## 2021-03-10 NOTE — Patient Instructions (Signed)
  At our [facility], we work as an integrated team, providing care to address both physical and emotional health. Your medical provider may refer you to see our Behavioral Health Clinician (BHC) on the same day you see your medical provider, as availability permits; often scheduled virtually at your convenience.  Our BHC is available to all patients, visits generally last between 20-30 minutes, but can be longer or shorter, depending on patient need. The BHC offers help with stress management, coping with symptoms of depression and anxiety, major life changes , sleep issues, changing risky behavior, grief and loss, life stress, working on personal life goals, and  behavioral health issues, as these all affect your overall health and wellness.  The BHC is NOT available for the following: FMLA paperwork, court-ordered evaluations, specialty assessments (custody or disability), letters to employers, or obtaining certification for an emotional support animal. The BHC does not provide long-term therapy. You have the right to refuse integrated behavioral health services, or to reschedule to see the BHC at a later date.  Confidentiality exception: If it is suspected that a child or disabled adult is being abused or neglected, we are required by law to report that to either Child Protective Services or Adult Protective Services.  If you have a diagnosis of Bipolar affective disorder, Schizophrenia, or recurrent Major depressive disorder, we will recommend that you establish care with a psychiatrist, as these are lifelong, chronic conditions, and we want your overall emotional health and medications to be more closely monitored. If you anticipate needing extended maternity leave due to mental health issues postpartum, it it recommended you inform your medical provider, so we can put in a referral to a psychiatrist as soon as possible. The BHC is unable to recommend an extended maternity leave for mental  health issues. Your medical provider or BHC may refer you to a therapist for ongoing, traditional therapy, or to a psychiatrist, for medication management, if it would benefit your overall health. Depending on your insurance, you may have a copay or be charged a deductible, depending on your insurance, to see the BHC. If you are uninsured, it is recommended that you apply for financial assistance. (Forms may be requested at the front desk for in-person visits, via MyChart, or request a form during a virtual visit).  If you see the BHC more than 6 times, you will have to complete a comprehensive clinical assessment interview with the BHC to resume integrated services.  For virtual visits with the BHC, you must be physically in the state of [place] at the time of the visit. For example, if you live in [place], you will have to do an in-person visit with the BHC, and your out-of-state insurance may not cover behavioral health services in [place]. If you are going out of the state or country for any reason, the BHC may see you virtually when you return to [place], but not while you are physically outside of [place].    

## 2021-03-10 NOTE — Progress Notes (Signed)
New OB Intake  I connected with  Kimberly Farley on 03/10/21 at  1:15 PM EDT by MyChart Video Visit and verified that I am speaking with the correct person using two identifiers. Nurse is located at [facility] and pt is located at home.  I discussed the limitations, risks, security and privacy concerns of performing an evaluation and management service by telephone and the availability of in person appointments. I also discussed with the patient that there may be a patient responsible charge related to this service. The patient expressed understanding and agreed to proceed.  I explained I am completing New OB Intake today. We discussed her EDD of 09/23/21 that is based on LMP of 12/17/20. Pt is G2/P1. I reviewed her allergies, medications, Medical/Surgical/OB history, and appropriate screenings. I informed her of [facility] services. Based on history, this is a/an  pregnancy complicated by hypertension .   Patient Active Problem List   Diagnosis Date Noted   SVD (spontaneous vaginal delivery) 01/07/2016   Active labor 01/05/2016   Pregnancy, supervision, high-risk 01/03/2016   Appendicitis 12/31/2015   Supervision of normal first pregnancy 11/10/2015   Late prenatal care affecting pregnancy in third trimester, antepartum 11/10/2015    Concerns addressed today  Delivery Plans:  Plans to deliver at [facility].   MyChart/Babyscripts MyChart access verified. I explained pt will have some visits in office and some virtually. Babyscripts instructions given and order placed. Patient verifies receipt of registration text/e-mail. Account successfully created and app downloaded.  Blood Pressure Cuff  Blood pressure cuff ordered for patient to pick-up from [employer]. Explained after first prenatal appt pt will check weekly and document in Babyscripts.Has own BP Cuff.  Weight scale: Patient    have weight scale. Weight scale ordered for patient to pick up form [facility].   Anatomy US Explained  first scheduled [place] will be around 19 weeks. Anatomy US scheduled for 04/29/21 at 8:45a. Pt notified to arrive at 0830a.  Labs Discussed Avelina Laine genetic screening with patient. Would like both Panorama and Horizon drawn at new OB visit. Routine prenatal labs needed.  Covid Vaccine Patient has covid vaccine.   Mother/ Baby Dyad Candidate?    If yes, offer as possibility  Informed patient of [facility] website  and placed link in her AVS.   Social Determinants of Health Food Insecurity: Patient denies food insecurity. WIC Referral: Patient is interested in referral to [facility].  Transportation: Patient denies transportation needs. Childcare: Discussed no children allowed at ultrasound appointments. Offered childcare services; patient declines childcare services at this time.  Send link to Pregnancy Navigators   Placed OB Box on problem list and updated  First visit review I reviewed new OB appt with pt. I explained she will have a pelvic exam, ob bloodwork with genetic screening, and PAP smear. Explained pt will be seen by Dr. Macon Large at first visit; encounter routed to appropriate provider. Explained that patient will be seen by pregnancy navigator following visit with provider. [facility] information placed in AVS.   Henrietta Dine, CMA 03/10/2021  1:16 PM

## 2021-03-11 NOTE — Progress Notes (Signed)
Patient was assessed and managed by nursing staff during this encounter. I have reviewed the chart and agree with the documentation and plan.   Jaynie Collins, MD 03/11/2021 7:59 AM

## 2021-03-20 ENCOUNTER — Other Ambulatory Visit (HOSPITAL_COMMUNITY)
Admission: RE | Admit: 2021-03-20 | Discharge: 2021-03-20 | Disposition: A | Discharge status: Home / Self Care | Payer: Medicaid Other | Source: Ambulatory Visit | Attending: Obstetrics & Gynecology | Admitting: Obstetrics & Gynecology

## 2021-03-20 ENCOUNTER — Ambulatory Visit (INDEPENDENT_AMBULATORY_CARE_PROVIDER_SITE_OTHER): Payer: Medicaid Other | Admitting: Obstetrics & Gynecology

## 2021-03-20 ENCOUNTER — Encounter: Payer: Self-pay | Admitting: Obstetrics & Gynecology

## 2021-03-20 ENCOUNTER — Other Ambulatory Visit: Payer: Self-pay

## 2021-03-20 VITALS — BP 129/88 | HR 106 | Wt 191.7 lb

## 2021-03-20 DIAGNOSIS — Z3A13 13 weeks gestation of pregnancy: Secondary | ICD-10-CM | POA: Diagnosis not present

## 2021-03-20 DIAGNOSIS — O0990 Supervision of high risk pregnancy, unspecified, unspecified trimester: Secondary | ICD-10-CM | POA: Diagnosis not present

## 2021-03-20 DIAGNOSIS — I10 Essential (primary) hypertension: Secondary | ICD-10-CM | POA: Insufficient documentation

## 2021-03-20 DIAGNOSIS — R7303 Prediabetes: Secondary | ICD-10-CM

## 2021-03-20 DIAGNOSIS — O10919 Unspecified pre-existing hypertension complicating pregnancy, unspecified trimester: Secondary | ICD-10-CM

## 2021-03-20 MED ORDER — ASPIRIN EC 81 MG PO TBEC: 81.0000 mg | DELAYED_RELEASE_TABLET | Freq: Every day | ORAL | 2 refills | Status: DC

## 2021-03-20 MED ORDER — ONDANSETRON 4 MG PO TBDP: 4.0000 mg | ORAL_TABLET | Freq: Four times a day (QID) | ORAL | 0 refills | Status: DC | PRN

## 2021-03-20 NOTE — Progress Notes (Signed)
History:   Kimberly Farley is a 30 y.o. G2P1001 at [redacted]w[redacted]d by LMP being seen today for her first obstetrical visit.  Her obstetrical history is significant for  term SVD . Patient intends to breast feed. Pregnancy history fully reviewed.  Patient reports no complaints. Does have nausea, wants something other than Phenergan to help that is non-sedating.      HISTORY: OB History  Gravida Para Term Preterm AB Living  $Remov'2 1 1 'LbhPdR$ 0 0 1  SAB IAB Ectopic Multiple Live Births  0 0 0 0 1    # Outcome Date GA Lbr Len/2nd Weight Sex Delivery Anes PTL Lv  2 Current           1 Term 01/05/16 [redacted]w[redacted]d 10:33 / 00:53 5 lb 5.9 oz (2.435 kg) M Vag-Spont Other  LIV     Name: GADDY,BOY [place]     Apgar1: 8  Apgar5: 9   Last pap smear was done 2017.  Past Medical History:  Diagnosis Date  . Hypertension    Past Surgical History:  Procedure Laterality Date  . APPENDECTOMY    . LAPAROSCOPIC APPENDECTOMY Right 12/31/2015   Procedure: APPENDECTOMY LAPAROSCOPIC;  Surgeon: Alphonsa Overall, MD;  Location: [facility];  Service: General;  Laterality: Right;   Family History  Problem Relation Age of Onset  . Hypertension Mother   . Hypertension Maternal Grandmother   . Stroke Maternal Grandmother   . Hearing loss Maternal Grandmother   . Alcohol abuse Maternal Grandfather    Social History   Tobacco Use  . Smoking status: Never  . Smokeless tobacco: Never  Substance Use Topics  . Alcohol use: Yes    Comment: socially until pregnancy  . Drug use: No   Allergies  Allergen Reactions  . Penicillins Nausea Only    Has patient had a PCN reaction causing immediate rash, facial/tongue/throat swelling, SOB or lightheadedness with hypotension: No Has patient had a PCN reaction causing severe rash involving mucus membranes or skin necrosis: No Has patient had a PCN reaction that required hospitalization No Has patient had a PCN reaction occurring within the last 10 years: Yes If all of the above answers are  "NO", then may proceed with Cephalosporin use.    Current Outpatient Medications on File Prior to Visit  Medication Sig Dispense Refill  . labetalol (NORMODYNE) 200 MG tablet Take 1 tablet (200 mg total) by mouth 2 (two) times daily. 60 tablet 3  . prenatal vitamin w/FE, FA (PRENATAL 1 + 1) 27-1 MG TABS tablet Take 1 tablet by mouth daily at 12 noon. 30 tablet 11  . promethazine (PHENERGAN) 25 MG tablet Take 1 tablet (25 mg total) by mouth every 6 (six) hours as needed for nausea or vomiting. 30 tablet 1   No current facility-administered medications on file prior to visit.    Review of Systems Pertinent items noted in HPI and remainder of comprehensive ROS otherwise negative.  Physical Exam:   Vitals:   03/20/21 1001  BP: 129/88  Pulse: (!) 106  Weight: 191 lb 11.2 oz (87 kg)   Fetal Heart Rate (bpm): 155   General: well-developed, well-nourished female in no acute distress  Breasts:  deferred  Skin: normal coloration and turgor, no rashes  Neurologic: oriented, normal, negative, normal mood  Extremities: normal strength, tone, and muscle mass, ROM of all joints is normal  HEENT PERRLA, extraocular movement intact and sclera clear, anicteric  Neck supple and no masses  Cardiovascular: regular rate and rhythm  Respiratory:  no respiratory distress, normal breath sounds  Abdomen: soft, non-tender; bowel sounds normal; no masses,  no organomegaly  Pelvic: normal external genitalia, no lesions, normal vaginal mucosa, normal vaginal discharge, normal cervix, pap smear done. Exam done in the presence of a chaperone.     Assessment:    Pregnancy: G2P1001 Patient Active Problem List   Diagnosis Date Noted  . Chronic hypertension affecting pregnancy 03/20/2021  . Supervision of high risk pregnancy, antepartum 03/10/2021     Plan:    1. Chronic hypertension affecting pregnancy - Protein / creatinine ratio, urine - Comp Met (CMET) - aspirin EC 81 MG tablet; Take 1 tablet (81  mg total) by mouth daily. Take after 12 weeks for prevention of preeclampsia later in pregnancy  Dispense: 300 tablet; Refill: 2  2. [redacted] weeks gestation of pregnancy 3. Supervision of high risk pregnancy, antepartum - CBC/D/Plt+RPR+Rh+ABO+RubIgG... - CHL AMB BABYSCRIPTS OPT IN - Hemoglobin A1c - Culture, OB Urine - Hepatitis C antibody - Protein / creatinine ratio, urine - Comp Met (CMET) - TSH - ondansetron (ZOFRAN ODT) 4 MG disintegrating tablet; Take 1 tablet (4 mg total) by mouth every 6 (six) hours as needed for nausea.  Dispense: 20 tablet; Refill: 0 - Genetic Screening - Cytology - PAP( [place]) Initial labs drawn. Continue prenatal vitamins. Problem list reviewed and updated. Genetic Screening discussed, NIPS: ordered. Ultrasound discussed; fetal anatomic survey:  already scheduled . Anticipatory guidance about prenatal visits given including labs, ultrasounds, and testing. Zofran ordered for nausea. Discussed usage of Babyscripts and virtual visits as additional source of managing and completing prenatal visits in midst of coronavirus and pandemic.   Encouraged to complete MyChart Registration for her ability to review results, send requests, and have questions addressed.  The nature of [place] for [facility] with multiple MDs and Advanced Practice Providers was explained to patient; also emphasized that residents, students are part of our team. Routine obstetric precautions reviewed. Encouraged to seek out care at office or emergency room [facility] MAU preferred) for urgent and/or emergent concerns. Return in about 4 weeks (around 04/17/2021) for OFFICE OB VISIT (MD only).     Verita Schneiders, MD, [place] for [employer], [place]

## 2021-03-21 LAB — HEPATITIS C ANTIBODY: Hep C Virus Ab: 0.2 s/co ratio (ref 0.0–0.9)

## 2021-03-21 LAB — COMPREHENSIVE METABOLIC PANEL
ALT: 12 IU/L (ref 0–32)
AST: 29 IU/L (ref 0–40)
Albumin/Globulin Ratio: 1.2 (ref 1.2–2.2)
Albumin: 3.7 g/dL — ABNORMAL LOW (ref 3.9–5.0)
Alkaline Phosphatase: 68 IU/L (ref 44–121)
BUN/Creatinine Ratio: 9 (ref 9–23)
BUN: 6 mg/dL (ref 6–20)
Bilirubin Total: 0.2 mg/dL (ref 0.0–1.2)
CO2: 21 mmol/L (ref 20–29)
Calcium: 9.7 mg/dL (ref 8.7–10.2)
Chloride: 101 mmol/L (ref 96–106)
Creatinine, Ser: 0.64 mg/dL (ref 0.57–1.00)
Globulin, Total: 3.2 g/dL (ref 1.5–4.5)
Glucose: 82 mg/dL (ref 70–99)
Potassium: 3.9 mmol/L (ref 3.5–5.2)
Sodium: 134 mmol/L (ref 134–144)
Total Protein: 6.9 g/dL (ref 6.0–8.5)
eGFR: 123 mL/min/{1.73_m2} (ref >59)

## 2021-03-21 LAB — CBC/D/PLT+RPR+RH+ABO+RUBIGG...
Antibody Screen: NEGATIVE
Basophils Absolute: 0.1 10*3/uL (ref 0.0–0.2)
Basos: 0 %
EOS (ABSOLUTE): 0.2 10*3/uL (ref 0.0–0.4)
Eos: 2 %
HCV Ab: 0.2 s/co ratio (ref 0.0–0.9)
HIV Screen 4th Generation wRfx: NONREACTIVE
Hematocrit: 34.8 % (ref 34.0–46.6)
Hemoglobin: 11.5 g/dL (ref 11.1–15.9)
Hepatitis B Surface Ag: NEGATIVE
Immature Grans (Abs): 0 10*3/uL (ref 0.0–0.1)
Immature Granulocytes: 0 %
Lymphocytes Absolute: 2.5 10*3/uL (ref 0.7–3.1)
Lymphs: 18 %
MCH: 27.3 pg (ref 26.6–33.0)
MCHC: 33 g/dL (ref 31.5–35.7)
MCV: 83 fL (ref 79–97)
Monocytes Absolute: 0.8 10*3/uL (ref 0.1–0.9)
Monocytes: 6 %
Neutrophils Absolute: 10.3 10*3/uL — ABNORMAL HIGH (ref 1.4–7.0)
Neutrophils: 74 %
Platelets: 362 10*3/uL (ref 150–450)
RBC: 4.21 x10E6/uL (ref 3.77–5.28)
RDW: 18.8 % — ABNORMAL HIGH (ref 11.7–15.4)
RPR Ser Ql: NONREACTIVE
Rh Factor: POSITIVE
Rubella Antibodies, IGG: 4.53 index (ref >0.99)
WBC: 13.9 10*3/uL — ABNORMAL HIGH (ref 3.4–10.8)

## 2021-03-21 LAB — HEMOGLOBIN A1C
Est. average glucose Bld gHb Est-mCnc: 120 mg/dL
Hgb A1c MFr Bld: 5.8 % — ABNORMAL HIGH (ref 4.8–5.6)

## 2021-03-21 LAB — HCV INTERPRETATION

## 2021-03-21 LAB — TSH: TSH: 0.502 u[IU]/mL (ref 0.450–4.500)

## 2021-03-22 LAB — PROTEIN / CREATININE RATIO, URINE
Creatinine, Urine: 235.3 mg/dL
Protein, Ur: 19.5 mg/dL
Protein/Creat Ratio: 83 mg/g creat (ref 0–200)

## 2021-03-22 LAB — CULTURE, OB URINE

## 2021-03-22 LAB — URINE CULTURE, OB REFLEX

## 2021-03-23 ENCOUNTER — Telehealth: Payer: Self-pay

## 2021-03-23 ENCOUNTER — Encounter: Payer: Self-pay | Admitting: Obstetrics & Gynecology

## 2021-03-23 DIAGNOSIS — O99210 Obesity complicating pregnancy, unspecified trimester: Secondary | ICD-10-CM | POA: Insufficient documentation

## 2021-03-23 DIAGNOSIS — O0990 Supervision of high risk pregnancy, unspecified, unspecified trimester: Secondary | ICD-10-CM

## 2021-03-23 DIAGNOSIS — R7303 Prediabetes: Secondary | ICD-10-CM | POA: Insufficient documentation

## 2021-03-23 NOTE — Telephone Encounter (Addendum)
-----   Message from Tereso Newcomer, MD sent at 03/23/2021  2:57 PM EST ----- Patient has prediabetic HgA1C of 5.8 and obesity in pregnancy.  Early 2 hr GTT recommended ASAP.   Please call to inform patient of results and recommendations.   Called pt. Result and provider recommendation given to patient. Pt agreeable to early 2 hour. Scheduled for lab appt 04/01/21 at 0820; future order placed.

## 2021-03-26 ENCOUNTER — Other Ambulatory Visit: Payer: Self-pay | Admitting: Obstetrics and Gynecology

## 2021-03-26 ENCOUNTER — Telehealth: Payer: Self-pay

## 2021-03-26 DIAGNOSIS — A749 Chlamydial infection, unspecified: Secondary | ICD-10-CM

## 2021-03-26 DIAGNOSIS — O98812 Other maternal infectious and parasitic diseases complicating pregnancy, second trimester: Secondary | ICD-10-CM

## 2021-03-26 LAB — CYTOLOGY - PAP
Chlamydia: POSITIVE — AB
Comment: NEGATIVE
Comment: NEGATIVE
Comment: NEGATIVE
Comment: NORMAL
Diagnosis: UNDETERMINED — AB
High risk HPV: NEGATIVE
Neisseria Gonorrhea: NEGATIVE
Trichomonas: NEGATIVE

## 2021-03-26 MED ORDER — AZITHROMYCIN 500 MG PO TABS
1000.0000 mg | ORAL_TABLET | Freq: Every day | ORAL | 0 refills | Status: DC
Start: 2021-03-26 — End: 2021-03-30

## 2021-03-26 MED ORDER — AZITHROMYCIN 500 MG PO TABS: 1000.0000 mg | ORAL_TABLET | Freq: Every day | ORAL | 0 refills | Status: DC

## 2021-03-26 NOTE — Telephone Encounter (Signed)
STD report received. Pt tested positive for Chlamydia. VM left stating I am calling with results. MyChart message sent. Communicable disease report faxed to [facility].

## 2021-03-27 ENCOUNTER — Encounter: Payer: Self-pay | Admitting: Obstetrics & Gynecology

## 2021-03-27 DIAGNOSIS — A749 Chlamydial infection, unspecified: Secondary | ICD-10-CM | POA: Insufficient documentation

## 2021-03-27 DIAGNOSIS — R87610 Atypical squamous cells of undetermined significance on cytologic smear of cervix (ASC-US): Secondary | ICD-10-CM | POA: Insufficient documentation

## 2021-03-27 DIAGNOSIS — O98811 Other maternal infectious and parasitic diseases complicating pregnancy, first trimester: Secondary | ICD-10-CM | POA: Insufficient documentation

## 2021-03-30 ENCOUNTER — Other Ambulatory Visit: Payer: Self-pay

## 2021-03-30 DIAGNOSIS — A749 Chlamydial infection, unspecified: Secondary | ICD-10-CM

## 2021-03-30 DIAGNOSIS — O98812 Other maternal infectious and parasitic diseases complicating pregnancy, second trimester: Secondary | ICD-10-CM

## 2021-03-30 MED ORDER — AZITHROMYCIN 500 MG PO TABS: 1000.0000 mg | ORAL_TABLET | Freq: Once | ORAL | 0 refills | Status: AC

## 2021-04-01 ENCOUNTER — Other Ambulatory Visit: Payer: Medicaid Other

## 2021-04-01 ENCOUNTER — Other Ambulatory Visit: Payer: Self-pay

## 2021-04-01 DIAGNOSIS — O0990 Supervision of high risk pregnancy, unspecified, unspecified trimester: Secondary | ICD-10-CM

## 2021-04-02 LAB — GLUCOSE TOLERANCE, 2 HOURS W/ 1HR
Glucose, 1 hour: 104 mg/dL (ref 70–179)
Glucose, 2 hour: 121 mg/dL (ref 70–152)
Glucose, Fasting: 89 mg/dL (ref 70–91)

## 2021-04-16 ENCOUNTER — Ambulatory Visit (INDEPENDENT_AMBULATORY_CARE_PROVIDER_SITE_OTHER): Payer: Medicaid Other | Admitting: Obstetrics & Gynecology

## 2021-04-16 ENCOUNTER — Other Ambulatory Visit: Payer: Self-pay

## 2021-04-16 VITALS — BP 130/84 | HR 110 | Wt 196.9 lb

## 2021-04-16 DIAGNOSIS — O0990 Supervision of high risk pregnancy, unspecified, unspecified trimester: Secondary | ICD-10-CM

## 2021-04-16 DIAGNOSIS — O10919 Unspecified pre-existing hypertension complicating pregnancy, unspecified trimester: Secondary | ICD-10-CM

## 2021-04-16 NOTE — Progress Notes (Signed)
Pt wants to come another day for AFP & Panorama/Horizon draw.

## 2021-04-16 NOTE — Progress Notes (Signed)
   PRENATAL VISIT NOTE  Subjective:  Kimberly Farley is a 30 y.o. G2P1001 at [redacted]w[redacted]d being seen today for ongoing prenatal care.  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  Patient reports nausea.  Contractions: Not present. Vag. Bleeding: None.  Movement: Present. Denies leaking of fluid.   The following portions of the patient's history were reviewed and updated as appropriate: allergies, current medications, past family history, past medical history, past social history, past surgical history and problem list.   Objective:   Vitals:   04/16/21 1520  BP: 130/84  Pulse: (!) 110  Weight: 196 lb 14.4 oz (89.3 kg)    Fetal Status: Fetal Heart Rate (bpm): 153 Fundal Height: 18 cm Movement: Present     General:  Alert, oriented and cooperative. Patient is in no acute distress.  Skin: Skin is warm and dry. No rash noted.   Cardiovascular: Normal heart rate noted  Respiratory: Normal respiratory effort, no problems with respiration noted  Abdomen: Soft, gravid, appropriate for gestational age.  Pain/Pressure: Absent     Pelvic: Cervical exam deferred        Extremities: Normal range of motion.  Edema: None  Mental Status: Normal mood and affect. Normal behavior. Normal judgment and thought content.   Assessment and Plan:  Pregnancy: G2P1001 at [redacted]w[redacted]d 1. Supervision of high risk pregnancy, antepartum Will defer until 12/21 - AFP, Serum, Open Spina Bifida; Future - Genetic Screening; Future  2. Chronic hypertension affecting pregnancy Labetalol  Preterm labor symptoms and general  obstetric precautions including but not limited to vaginal bleeding, contractions, leaking of fluid and fetal movement were reviewed in detail with the patient. Please refer to After Visit Summary for other counseling recommendations.   Return in about 4 weeks (around 05/14/2021).  Future Appointments  Date Time Provider Department Center  04/29/2021  8:30 AM [facility] NURSE [facility]  04/29/2021  8:45 AM [facility] US5 [facility]    Scheryl Darter, MD

## 2021-04-29 ENCOUNTER — Other Ambulatory Visit: Payer: Self-pay | Admitting: *Deleted

## 2021-04-29 ENCOUNTER — Ambulatory Visit: Payer: Medicaid Other | Attending: Obstetrics & Gynecology

## 2021-04-29 ENCOUNTER — Other Ambulatory Visit: Payer: Self-pay

## 2021-04-29 ENCOUNTER — Ambulatory Visit (HOSPITAL_BASED_OUTPATIENT_CLINIC_OR_DEPARTMENT_OTHER): Payer: Medicaid Other | Admitting: Maternal & Fetal Medicine

## 2021-04-29 ENCOUNTER — Ambulatory Visit: Payer: Medicaid Other | Admitting: *Deleted

## 2021-04-29 ENCOUNTER — Other Ambulatory Visit: Payer: Medicaid Other

## 2021-04-29 VITALS — BP 120/79 | HR 105

## 2021-04-29 DIAGNOSIS — A749 Chlamydial infection, unspecified: Secondary | ICD-10-CM | POA: Insufficient documentation

## 2021-04-29 DIAGNOSIS — O10913 Unspecified pre-existing hypertension complicating pregnancy, third trimester: Secondary | ICD-10-CM | POA: Diagnosis not present

## 2021-04-29 DIAGNOSIS — O10912 Unspecified pre-existing hypertension complicating pregnancy, second trimester: Secondary | ICD-10-CM

## 2021-04-29 DIAGNOSIS — O0990 Supervision of high risk pregnancy, unspecified, unspecified trimester: Secondary | ICD-10-CM | POA: Diagnosis not present

## 2021-04-29 DIAGNOSIS — O99210 Obesity complicating pregnancy, unspecified trimester: Secondary | ICD-10-CM

## 2021-04-29 DIAGNOSIS — O98811 Other maternal infectious and parasitic diseases complicating pregnancy, first trimester: Secondary | ICD-10-CM | POA: Diagnosis present

## 2021-04-29 DIAGNOSIS — O99212 Obesity complicating pregnancy, second trimester: Secondary | ICD-10-CM

## 2021-04-29 DIAGNOSIS — O10919 Unspecified pre-existing hypertension complicating pregnancy, unspecified trimester: Secondary | ICD-10-CM | POA: Diagnosis present

## 2021-04-29 DIAGNOSIS — Z3482 Encounter for supervision of other normal pregnancy, second trimester: Secondary | ICD-10-CM | POA: Diagnosis not present

## 2021-04-29 NOTE — Progress Notes (Signed)
MFM Brief Note  Kimberly Farley is a 30 yo G2P1 who is here at 19w 0d in consultation and for detailed exam at the request of Dr. Jaynie Collins regarding chronic hypertension.  Kimberly Farley is doing well without complaints. She denies s/sx of preeclampsia or preterm labor.  She is scheduled for genetic screening in the future.  Today a single intrauterine pregnancy was observed. Fetal anatomy was normal with measurements consistent with dates.  There was good fetal movement and amniotic fluid volume. Suboptimal views of the fetal anatomy was seen due to fetal position.  I discussed today's ultrasound and reviewed the management of chronic hypertension in pregnancy.  We discussed the increased risk for preeclampsia, placental abruption and fetal growth restriction in women with chronic hypertension. She is taking low dose ASA for preeclampsia prevention.  I explained that serial growth exams should be performed every 4 weeks throughout the pregnancy. Medical therapy should be increased if blood pressure persist >150/100's, however, an ideal goal should be 135/85 mmHg. She is currently taking Labetalol 200 mg BID.  Given that [name] is on medical therapy we recommend weekly testing at 32 weeks.  She is aware of the s/sx or preeclampsia including headache, vision changes and right upper quadrant pain.  Baseline labs of UPC, CBC, and CMP were performed and were normal.  She is scheduled to return in 4-6 weeks.   I spent 20 minutes with > 50% in face to face consultation.  [name], MD  All questions answered.

## 2021-05-01 LAB — AFP, SERUM, OPEN SPINA BIFIDA
AFP MoM: 1.35
AFP Value: 64.5 ng/mL
Gest. Age on Collection Date: 18.9 weeks
Maternal Age At EDD: 28.5 yr
OSBR Risk 1 IN: 8303
Test Results:: NEGATIVE
Weight: 197 [lb_av]

## 2021-05-10 NOTE — L&D Delivery Note (Signed)
OB/GYN Faculty Practice Delivery Note ? ?Bren Borys is a 31 y.o. G2P1001 s/p VD at [redacted]w[redacted]d. She was admitted for IOL due to chronic HTN.  ? ?ROM: 4h 42m with clear fluid ?GBS Status: Negative/-- (04/26 1145) ?Maximum Maternal Temperature: 98.80F ? ?Labor Progress: ?Initial SVE: 0/thick/-3. She then progressed to complete with assistance of cytotec, AROM, and small amount of pit.  ? ?Delivery Date/Time: 2056  ?Delivery: Called to room and patient was complete. After a few pushes, the head delivered L OA. Loose nuchal cord present. Shoulder and body delivered in usual fashion. Infant with spontaneous cry, placed on mother's abdomen, dried and stimulated. Cord clamped x 2 after 1-minute delay, and cut by FOB. Cord blood drawn. Placenta delivered spontaneously with gentle cord traction. Fundus firm with massage, lower uterine sweep with moderate amount of clots, and Pitocin. TXA was given. Labia, perineum, vagina, and cervix inspected with a right peri-urethral laceration that was repaired in usual fashion with 4.0 Monocryl. Urethral opening was visualized for the entirety of repair and avoided.  ?Baby Weight: pending ? ?Placenta: 3 vessel, intact. Sent to L&D ?Complications: None ?Lacerations: Right peri-urethral  ?EBL: 350 mL ?Analgesia: lidocaine and IV fent for repair after delivery  ? ?Infant:  ?APGAR (1 MIN): 9   ?APGAR (5 MINS): 9   ? ?Leticia Penna, DO  ?[facility] Family Medicine Fellow, Faculty Practice ?Center for [employer], [facility] ?09/16/2021, 9:46 PM ? ? ? ?  ?

## 2021-05-14 ENCOUNTER — Other Ambulatory Visit: Payer: Self-pay

## 2021-05-14 ENCOUNTER — Ambulatory Visit (INDEPENDENT_AMBULATORY_CARE_PROVIDER_SITE_OTHER): Payer: Medicaid Other | Admitting: Obstetrics and Gynecology

## 2021-05-14 ENCOUNTER — Encounter: Payer: Self-pay | Admitting: Obstetrics and Gynecology

## 2021-05-14 ENCOUNTER — Other Ambulatory Visit (HOSPITAL_COMMUNITY)
Admission: RE | Admit: 2021-05-14 | Discharge: 2021-05-14 | Disposition: A | Discharge status: Home / Self Care | Payer: Medicaid Other | Source: Ambulatory Visit | Attending: Obstetrics and Gynecology | Admitting: Obstetrics and Gynecology

## 2021-05-14 VITALS — BP 121/22 | HR 110 | Wt 197.0 lb

## 2021-05-14 DIAGNOSIS — O98811 Other maternal infectious and parasitic diseases complicating pregnancy, first trimester: Secondary | ICD-10-CM | POA: Diagnosis present

## 2021-05-14 DIAGNOSIS — A749 Chlamydial infection, unspecified: Secondary | ICD-10-CM | POA: Diagnosis present

## 2021-05-14 DIAGNOSIS — O10919 Unspecified pre-existing hypertension complicating pregnancy, unspecified trimester: Secondary | ICD-10-CM

## 2021-05-14 DIAGNOSIS — O0990 Supervision of high risk pregnancy, unspecified, unspecified trimester: Secondary | ICD-10-CM

## 2021-05-14 DIAGNOSIS — R87610 Atypical squamous cells of undetermined significance on cytologic smear of cervix (ASC-US): Secondary | ICD-10-CM

## 2021-05-14 NOTE — Patient Instructions (Signed)

## 2021-05-14 NOTE — Progress Notes (Signed)
Subjective:  Natalie Cuadros is a 31 y.o. G2P1001 at [redacted]w[redacted]d being seen today for ongoing prenatal care.  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  Patient reports no complaints.  Contractions: Not present. Vag. Bleeding: None.  Movement: Present. Denies leaking of fluid.   The following portions of the patient's history were reviewed and updated as appropriate: allergies, current medications, past family history, past medical history, past social history, past surgical history and problem list. Problem list updated.  Objective:   Vitals:   05/14/21 1041  BP: (!) 121/22  Pulse: (!) 110  Weight: 197 lb (89.4 kg)    Fetal Status: Fetal Heart Rate (bpm): 148   Movement: Present     General:  Alert, oriented and cooperative. Patient is in no acute distress.  Skin: Skin is warm and dry. No rash noted.   Cardiovascular: Normal heart rate noted  Respiratory: Normal respiratory effort, no problems with respiration noted  Abdomen: Soft, gravid, appropriate for gestational age. Pain/Pressure: Present     Pelvic:  Cervical exam deferred        Extremities: Normal range of motion.  Edema: Trace  Mental Status: Normal mood and affect. Normal behavior. Normal judgment and thought content.   Urinalysis:      Assessment and Plan:  Pregnancy: G2P1001 at [redacted]w[redacted]d  1. Supervision of high risk pregnancy, antepartum Stable   2. Chronic hypertension affecting pregnancy BP stable Continue with Labetalol and qd BASA Serial growth scans and antenatal testing as per protocol  3. Atypical squamous  cell changes of undetermined significance (ASCUS) with negative high risk human papilloma virus (HPV) pap smear 03/20/21 Stable  4. Chlamydia infection affecting pregnancy in first trimester TOC today - Cervicovaginal ancillary only( [place])  Preterm labor symptoms and general obstetric precautions including but not limited to vaginal bleeding, contractions, leaking of fluid and fetal movement were reviewed in detail with the patient. Please refer to After Visit Summary for other counseling recommendations.  Return in about 4 weeks (around 06/11/2021) for OB visit, face to face, MD only, fasting for glucola.   Chancy Milroy, MD

## 2021-05-15 LAB — CERVICOVAGINAL ANCILLARY ONLY
Bacterial Vaginitis (gardnerella): NEGATIVE
Candida Glabrata: NEGATIVE
Candida Vaginitis: NEGATIVE
Chlamydia: NEGATIVE
Comment: NEGATIVE
Comment: NEGATIVE
Comment: NEGATIVE
Comment: NEGATIVE
Comment: NEGATIVE
Comment: NORMAL
Neisseria Gonorrhea: NEGATIVE
Trichomonas: NEGATIVE

## 2021-06-03 ENCOUNTER — Other Ambulatory Visit: Payer: Self-pay

## 2021-06-03 ENCOUNTER — Encounter: Payer: Self-pay | Admitting: *Deleted

## 2021-06-03 ENCOUNTER — Other Ambulatory Visit: Payer: Self-pay | Admitting: *Deleted

## 2021-06-03 ENCOUNTER — Ambulatory Visit: Payer: Medicaid Other | Admitting: *Deleted

## 2021-06-03 ENCOUNTER — Ambulatory Visit: Payer: Medicaid Other | Attending: Obstetrics and Gynecology

## 2021-06-03 VITALS — BP 127/77 | HR 104

## 2021-06-03 DIAGNOSIS — Z3A24 24 weeks gestation of pregnancy: Secondary | ICD-10-CM | POA: Diagnosis not present

## 2021-06-03 DIAGNOSIS — O10012 Pre-existing essential hypertension complicating pregnancy, second trimester: Secondary | ICD-10-CM

## 2021-06-03 DIAGNOSIS — O10912 Unspecified pre-existing hypertension complicating pregnancy, second trimester: Secondary | ICD-10-CM | POA: Diagnosis present

## 2021-06-03 DIAGNOSIS — O99210 Obesity complicating pregnancy, unspecified trimester: Secondary | ICD-10-CM | POA: Diagnosis present

## 2021-06-03 DIAGNOSIS — O10919 Unspecified pre-existing hypertension complicating pregnancy, unspecified trimester: Secondary | ICD-10-CM | POA: Diagnosis present

## 2021-06-03 DIAGNOSIS — A749 Chlamydial infection, unspecified: Secondary | ICD-10-CM | POA: Insufficient documentation

## 2021-06-03 DIAGNOSIS — Z6833 Body mass index (BMI) 33.0-33.9, adult: Secondary | ICD-10-CM

## 2021-06-03 DIAGNOSIS — O98811 Other maternal infectious and parasitic diseases complicating pregnancy, first trimester: Secondary | ICD-10-CM | POA: Diagnosis present

## 2021-06-03 DIAGNOSIS — O0990 Supervision of high risk pregnancy, unspecified, unspecified trimester: Secondary | ICD-10-CM | POA: Diagnosis present

## 2021-06-03 DIAGNOSIS — O99212 Obesity complicating pregnancy, second trimester: Secondary | ICD-10-CM | POA: Diagnosis not present

## 2021-06-10 ENCOUNTER — Other Ambulatory Visit: Payer: Self-pay

## 2021-06-10 DIAGNOSIS — O0990 Supervision of high risk pregnancy, unspecified, unspecified trimester: Secondary | ICD-10-CM

## 2021-06-11 ENCOUNTER — Other Ambulatory Visit: Payer: Medicaid Other

## 2021-06-11 ENCOUNTER — Encounter: Payer: Self-pay | Admitting: Obstetrics and Gynecology

## 2021-06-11 ENCOUNTER — Other Ambulatory Visit: Payer: Self-pay

## 2021-06-11 ENCOUNTER — Ambulatory Visit (INDEPENDENT_AMBULATORY_CARE_PROVIDER_SITE_OTHER): Payer: Medicaid Other | Admitting: Obstetrics and Gynecology

## 2021-06-11 VITALS — BP 131/84 | HR 108 | Wt 204.8 lb

## 2021-06-11 DIAGNOSIS — O0990 Supervision of high risk pregnancy, unspecified, unspecified trimester: Secondary | ICD-10-CM

## 2021-06-11 DIAGNOSIS — O10919 Unspecified pre-existing hypertension complicating pregnancy, unspecified trimester: Secondary | ICD-10-CM

## 2021-06-11 NOTE — Progress Notes (Signed)
Subjective:  Kimberly Farley is a 31 y.o. G2P1001 at [redacted]w[redacted]d being seen today for ongoing prenatal care.  She is currently monitored for the following issues for this high-risk pregnancy and has Supervision of high risk pregnancy, antepartum; Chronic hypertension affecting pregnancy; Prediabetes; Obesity in pregnancy, antepartum; and Atypical squamous cell changes of undetermined significance (ASCUS) with negative high risk human papilloma virus (HPV) pap smear 03/20/21 on their problem list.  Patient reports no complaints.  Contractions: Not present. Vag. Bleeding: None.  Movement: Present. Denies leaking of fluid.   The following portions of the patient's history were reviewed and updated as appropriate: allergies, current medications, past family history, past medical history, past social history, past surgical history and problem list. Problem list updated.  Objective:   Vitals:   06/11/21 0853  BP: 131/84  Pulse: (!) 108  Weight: 204 lb 12.8 oz (92.9 kg)    Fetal Status: Fetal Heart Rate (bpm): 162   Movement: Present     General:  Alert, oriented and cooperative. Patient is in no acute distress.  Skin: Skin is warm and dry. No rash noted.   Cardiovascular: Normal heart rate noted  Respiratory: Normal respiratory effort, no problems with respiration noted  Abdomen: Soft, gravid, appropriate for gestational age. Pain/Pressure: Present     Pelvic:  Cervical exam deferred        Extremities: Normal range of motion.  Edema: Trace  Mental Status: Normal mood and affect. Normal behavior. Normal judgment and thought content.   Urinalysis:      Assessment and Plan:  Pregnancy: G2P1001 at [redacted]w[redacted]d  1. Supervision of high risk pregnancy, antepartum Stable Glucola today  2. Chronic hypertension affecting pregnancy BP stable Continue with current regiment Growth 62 % on 06/03/21  Preterm labor symptoms and general obstetric precautions including but not limited to vaginal bleeding,  contractions, leaking of fluid and fetal movement were reviewed in detail with the patient. Please refer to After Visit Summary for other counseling recommendations.  Return in about 3 weeks (around 07/02/2021) for OB visit, face to face, MD only.   Chancy Milroy, MD

## 2021-06-11 NOTE — Patient Instructions (Signed)

## 2021-06-12 LAB — HIV ANTIBODY (ROUTINE TESTING W REFLEX): HIV Screen 4th Generation wRfx: NONREACTIVE

## 2021-06-12 LAB — GLUCOSE TOLERANCE, 2 HOURS W/ 1HR
Glucose, 1 hour: 116 mg/dL (ref 70–179)
Glucose, 2 hour: 109 mg/dL (ref 70–152)
Glucose, Fasting: 81 mg/dL (ref 70–91)

## 2021-06-12 LAB — CBC
Hematocrit: 36.9 % (ref 34.0–46.6)
Hemoglobin: 12.3 g/dL (ref 11.1–15.9)
MCH: 30.7 pg (ref 26.6–33.0)
MCHC: 33.3 g/dL (ref 31.5–35.7)
MCV: 92 fL (ref 79–97)
Platelets: 298 10*3/uL (ref 150–450)
RBC: 4.01 x10E6/uL (ref 3.77–5.28)
RDW: 15.8 % — ABNORMAL HIGH (ref 11.7–15.4)
WBC: 11.2 10*3/uL — ABNORMAL HIGH (ref 3.4–10.8)

## 2021-06-12 LAB — RPR: RPR Ser Ql: NONREACTIVE

## 2021-07-02 ENCOUNTER — Encounter: Payer: Self-pay | Admitting: *Deleted

## 2021-07-02 ENCOUNTER — Ambulatory Visit: Payer: Medicaid Other | Admitting: *Deleted

## 2021-07-02 ENCOUNTER — Ambulatory Visit (INDEPENDENT_AMBULATORY_CARE_PROVIDER_SITE_OTHER): Payer: Medicaid Other | Admitting: Obstetrics and Gynecology

## 2021-07-02 ENCOUNTER — Ambulatory Visit: Payer: Medicaid Other | Attending: Maternal & Fetal Medicine

## 2021-07-02 ENCOUNTER — Other Ambulatory Visit: Payer: Self-pay

## 2021-07-02 ENCOUNTER — Other Ambulatory Visit: Payer: Self-pay | Admitting: *Deleted

## 2021-07-02 VITALS — BP 122/76 | HR 104 | Wt 203.4 lb

## 2021-07-02 VITALS — BP 113/65 | HR 109

## 2021-07-02 DIAGNOSIS — O10913 Unspecified pre-existing hypertension complicating pregnancy, third trimester: Secondary | ICD-10-CM

## 2021-07-02 DIAGNOSIS — O99210 Obesity complicating pregnancy, unspecified trimester: Secondary | ICD-10-CM | POA: Insufficient documentation

## 2021-07-02 DIAGNOSIS — O10912 Unspecified pre-existing hypertension complicating pregnancy, second trimester: Secondary | ICD-10-CM | POA: Diagnosis present

## 2021-07-02 DIAGNOSIS — Z3A28 28 weeks gestation of pregnancy: Secondary | ICD-10-CM

## 2021-07-02 DIAGNOSIS — O10919 Unspecified pre-existing hypertension complicating pregnancy, unspecified trimester: Secondary | ICD-10-CM

## 2021-07-02 DIAGNOSIS — Z6833 Body mass index (BMI) 33.0-33.9, adult: Secondary | ICD-10-CM | POA: Diagnosis not present

## 2021-07-02 DIAGNOSIS — O10013 Pre-existing essential hypertension complicating pregnancy, third trimester: Secondary | ICD-10-CM | POA: Diagnosis not present

## 2021-07-02 DIAGNOSIS — O0990 Supervision of high risk pregnancy, unspecified, unspecified trimester: Secondary | ICD-10-CM | POA: Diagnosis present

## 2021-07-02 DIAGNOSIS — Z6837 Body mass index (BMI) 37.0-37.9, adult: Secondary | ICD-10-CM

## 2021-07-02 NOTE — Progress Notes (Signed)
° °  PRENATAL VISIT NOTE  Subjective:  Kimberly Farley is a 31 y.o. G2P1001 at [redacted]w[redacted]d being seen today for ongoing prenatal care.  She is currently monitored for the following issues for this high-risk pregnancy and has Supervision of high risk pregnancy, antepartum; Chronic hypertension affecting pregnancy; Prediabetes; Obesity in pregnancy, antepartum; and Atypical squamous cell changes of undetermined significance (ASCUS) with negative high risk human papilloma virus (HPV) pap smear 03/20/21 on their problem list.  Patient reports no complaints.  Contractions: Not present. Vag. Bleeding: None.  Movement: Present. Denies leaking of fluid.   The following portions of the patient's history were reviewed and updated as appropriate: allergies, current medications, past family history, past medical history, past social history, past surgical history and problem list.   Objective:   Vitals:   07/02/21 1046  BP: 122/76  Pulse: (!) 104  Weight: 203 lb 6.4 oz (92.3 kg)    Fetal Status: Fetal Heart Rate (bpm): 161   Movement: Present     General:  Alert, oriented and cooperative. Patient is in no acute distress.  Skin: Skin is warm and dry. No rash noted.   Cardiovascular: Normal heart rate noted  Respiratory: Normal respiratory effort, no problems with respiration noted  Abdomen: Soft, gravid, appropriate for gestational age.  Pain/Pressure: Absent     Pelvic: Cervical exam deferred        Extremities: Normal range of motion.  Edema: None  Mental Status: Normal mood and affect. Normal behavior. Normal judgment and thought content.   Assessment and Plan:  Pregnancy: G2P1001 at [redacted]w[redacted]d 1. [redacted] weeks gestation of pregnancy Labs normal last visit  2. Chronic hypertension affecting pregnancy Doing well on labetalol 200 bid Normal growth u/s today at 75%, 1340gm, ac 67%, AFI 22. Rpt scheduled for one month. Start weekly testing at 32-34wks  3. Obesity in pregnancy, antepartum  4. BMI  37.0-37.9, adult Weight good  Preterm labor symptoms and general obstetric precautions including but not limited to vaginal bleeding, contractions, leaking of fluid and fetal movement were reviewed in detail with the patient. Please refer to After Visit Summary for other counseling recommendations.   Return in about 2 weeks (around 07/16/2021) for in person or virtual, low risk ob, md or app.  Future Appointments  Date Time Provider [place]  08/03/2021  9:00 AM [facility] NURSE [facility]  08/03/2021  9:15 AM [facility] US2 [facility]  08/10/2021  9:15 AM [facility] NURSE [facility]  08/10/2021  9:30 AM [facility] US3 [facility]  08/17/2021  9:15 AM [facility] NURSE [facility]  08/17/2021  9:30 AM [facility]3 [facility]  08/24/2021  9:30 AM [facility] NURSE [facility]  08/24/2021  9:45 AM [facility]5 [facility]    Aletha Halim, MD

## 2021-07-16 ENCOUNTER — Telehealth (INDEPENDENT_AMBULATORY_CARE_PROVIDER_SITE_OTHER): Payer: Medicaid Other | Admitting: Obstetrics and Gynecology

## 2021-07-16 ENCOUNTER — Encounter: Payer: Self-pay | Admitting: Obstetrics and Gynecology

## 2021-07-16 DIAGNOSIS — O10919 Unspecified pre-existing hypertension complicating pregnancy, unspecified trimester: Secondary | ICD-10-CM

## 2021-07-16 DIAGNOSIS — O0990 Supervision of high risk pregnancy, unspecified, unspecified trimester: Secondary | ICD-10-CM

## 2021-07-16 DIAGNOSIS — O0993 Supervision of high risk pregnancy, unspecified, third trimester: Secondary | ICD-10-CM

## 2021-07-16 DIAGNOSIS — Z3A30 30 weeks gestation of pregnancy: Secondary | ICD-10-CM

## 2021-07-16 DIAGNOSIS — O99213 Obesity complicating pregnancy, third trimester: Secondary | ICD-10-CM

## 2021-07-16 DIAGNOSIS — O99210 Obesity complicating pregnancy, unspecified trimester: Secondary | ICD-10-CM

## 2021-07-16 DIAGNOSIS — O10913 Unspecified pre-existing hypertension complicating pregnancy, third trimester: Secondary | ICD-10-CM

## 2021-07-16 NOTE — Patient Instructions (Signed)

## 2021-07-16 NOTE — Progress Notes (Signed)
OBSTETRICS PRENATAL VIRTUAL VISIT ENCOUNTER NOTE  Provider location: [facility] [place] [facility]   Patient location: Home  I connected with Kimberly Farley on 07/16/21 at  4:15 PM EST by MyChart Video Encounter and verified that I am speaking with the correct person using two identifiers. I discussed the limitations, risks, security and privacy concerns of performing an evaluation and management service virtually and the availability of in person appointments. I also discussed with the patient that there may be a patient responsible charge related to this service. The patient expressed understanding and agreed to proceed. Subjective:  Kimberly Farley is a 31 y.o. G2P1001 at [redacted]w[redacted]d being seen today for ongoing prenatal care.  She is currently monitored for the following issues for this high-risk pregnancy and has Supervision of high risk pregnancy, antepartum; Chronic hypertension affecting pregnancy; Prediabetes; Obesity in pregnancy, antepartum; and Atypical squamous cell changes of undetermined significance (ASCUS) with negative high risk human papilloma virus (HPV) pap smear 03/20/21 on their problem list.  Patient reports no complaints.  Contractions: Not present. Vag. Bleeding: None.  Movement: Present. Denies any leaking of fluid.   The following portions of the patient's history were reviewed and updated as appropriate: allergies, current medications, past family history, past medical history, past social history, past surgical history and problem list.   Objective:  There were no vitals filed for this visit.  Fetal Status:     Movement: Present     General:  Alert, oriented and cooperative. Patient is in no acute distress.  Respiratory: Normal respiratory effort, no problems with respiration noted  Mental Status: Normal mood and affect. Normal behavior. Normal judgment and thought content.  Rest of physical exam deferred due to type of  encounter  Imaging: [place] MFM OB FOLLOW UP  Result Date: 07/02/2021 ----------------------------------------------------------------------  OBSTETRICS REPORT                       (Signed Final 07/02/2021 10:21 am) ---------------------------------------------------------------------- Patient Info  ID #:       [id number]                          D.O.B.:  1991-01-18 (30 yrs)  Name:       Kimberly Farley              Visit Date: 07/02/2021 09:26 am ---------------------------------------------------------------------- Performed By  Attending:        Sander Nephew      Ref. Address:     [place], [place]  [address]  Performed By:     Rodrigo Ran BS      [facility]                                                             [place] [facility]  Referred By:      Osborne Oman MD ---------------------------------------------------------------------- Orders  #  Description                           Code        Ordered By  1  [place] MFM OB FOLLOW UP                   [id number]    Sander Nephew ----------------------------------------------------------------------  #  Order #                     Accession #                Episode #  1  [id number]                   [id number]                 [id number] ---------------------------------------------------------------------- Indications  [redacted] weeks gestation of pregnancy                Z3A.28  Hypertension - Chronic/Pre-existing            O10.019  (labetalol)  Obesity complicating pregnancy, third          O99.213  trimester  (pregravid BMI 34)  Encounter for other antenatal screening        Z36.2  follow-up ---------------------------------------------------------------------- Fetal Evaluation  Num Of Fetuses:         1  Fetal Heart Rate(bpm):  155  Cardiac Activity:       Observed  Presentation:           Cephalic  Placenta:               Anterior  P. Cord Insertion:  Visualized  Amniotic Fluid  AFI FV:      Within normal limits  AFI Sum(cm)     %Tile       Largest Pocket(cm)  22.2            91          6.6  RUQ(cm)       RLQ(cm)       LUQ(cm)        LLQ(cm)  4.8           6.6           5.4            5.4 ---------------------------------------------------------------------- Biometry  BPD:      72.1  mm     G. Age:  29w 0d         64  %    CI:        71.92   %    70 - 86                                                          FL/HC:      20.6   %    18.8 - 20.6  HC:      270.6  mm     G. Age:  29w 4d         62  %    HC/AC:      1.10        1.05 - 1.21  AC:      247.1  mm     G. Age:  29w 0d         67  %    FL/BPD:     77.3   %    71 - 87  FL:       55.7  mm     G. Age:  29w 2d         70  %    FL/AC:      22.5   %    20 - 24  LV:        6.4  mm  Est. FW:    1340  gm    2 lb 15 oz      75  % ---------------------------------------------------------------------- OB History  Gravidity:    2         Term:   1  Living:       1 ---------------------------------------------------------------------- Gestational Age  LMP:           28w 1d        Date:  12/17/20                 EDD:   09/23/21  U/S Today:     29w 2d                                        EDD:   09/15/21  Best:          28w 1d     Det. By:  LMP  (12/17/20)          EDD:   09/23/21 ---------------------------------------------------------------------- Anatomy  Cranium:  Appears normal         LVOT:                   Appears normal  Cavum:                 Appears normal         Aortic Arch:            Previously seen  Ventricles:            Appears normal          Ductal Arch:            Previously seen  Choroid Plexus:        Previously seen        Diaphragm:              Previously seen  Cerebellum:            Appears normal         Stomach:                Appears normal, left                                                                        sided  Posterior Fossa:       Previously seen        Abdomen:                Appears normal  Nuchal Fold:           Appears normal         Abdominal Wall:         Previously seen  Face:                  Appears normal         Cord Vessels:           Previously seen                         (orbits and profile)  Lips:                  Appears normal         Kidneys:                Appear normal  Palate:                Previously seen        Bladder:                Appears normal  Thoracic:              Appears normal         Spine:                  Previously seen  Heart:                 Appears normal         Upper Extremities:      Previously seen                         (  4CH, axis, and                         situs)  RVOT:                  Previously seen        Lower Extremities:      Previously seen  Other:  VC and 3VTV seen, Female gender previously visualized. Nasal bone          visualized. Heels visualized. ---------------------------------------------------------------------- Cervix Uterus Adnexa  Cervix  Not visualized (advanced GA >24wks)  Uterus  No abnormality visualized.  Right Ovary  Within normal limits.  Left Ovary  Not visualized.  Cul De Sac  No free fluid seen.  Adnexa  No abnormality visualized. ---------------------------------------------------------------------- Impression  Follow up growth due to chronic hypertension on labetalol  Normal interval growth with measurements consistent with  dates  Good fetal movement and amniotic fluid volume  Blood pressure is 113/65 mmHg. ---------------------------------------------------------------------- Recommendations  Follow up growth in 4 weeks.  ----------------------------------------------------------------------               Sander Nephew, MD Electronically Signed Final Report   07/02/2021 10:21 am ----------------------------------------------------------------------   Assessment and Plan:  Pregnancy: G2P1001 at 104w1d 1. Obesity in pregnancy, antepartum Stable  2. Chronic hypertension affecting pregnancy BP stable on current regiment To start weekly antenatal testing and serial growth scans per protocol at 32 weeks  3. Supervision of high risk pregnancy, antepartum Stable  Preterm labor symptoms and general obstetric precautions including but not limited to vaginal bleeding, contractions, leaking of fluid and fetal movement were reviewed in detail with the patient. I discussed the assessment and treatment plan with the patient. The patient was provided an opportunity to ask questions and all were answered. The patient agreed with the plan and demonstrated an understanding of the instructions. The patient was advised to call back or seek an in-person office evaluation/go to [facility] at [facility] for any urgent or concerning symptoms. Please refer to After Visit Summary for other counseling recommendations.   I provided 8 minutes of face-to-face time during this encounter.  Return in about 2 weeks (around 07/30/2021) for OB visit, face to face, MD only.  Future Appointments  Date Time Provider [place]  08/03/2021  8:15 AM Griffin Basil, MD [facility]  08/03/2021  9:00 AM [facility] NURSE [facility]  08/03/2021  9:15 AM [facility] US2 [facility]  08/10/2021  9:15 AM [facility] NURSE [facility]  08/10/2021  9:30 AM [facility] US3 [facility]  08/17/2021  9:15 AM [facility] NURSE [facility]  08/17/2021  9:30 AM [facility] US3 [facility]  08/24/2021  9:30 AM [facility] NURSE [facility]  08/24/2021  9:45 AM [facility] US5 [facility] [place]    Chancy Milroy, MD [facility] [employer] [facility]

## 2021-08-03 ENCOUNTER — Ambulatory Visit: Payer: Medicaid Other | Attending: Maternal & Fetal Medicine

## 2021-08-03 ENCOUNTER — Other Ambulatory Visit: Payer: Self-pay

## 2021-08-03 ENCOUNTER — Ambulatory Visit (INDEPENDENT_AMBULATORY_CARE_PROVIDER_SITE_OTHER): Payer: Medicaid Other | Admitting: Obstetrics and Gynecology

## 2021-08-03 ENCOUNTER — Ambulatory Visit: Payer: Medicaid Other | Admitting: *Deleted

## 2021-08-03 ENCOUNTER — Encounter: Payer: Self-pay | Admitting: *Deleted

## 2021-08-03 VITALS — BP 134/77 | HR 115 | Wt 208.6 lb

## 2021-08-03 VITALS — BP 116/70 | HR 113

## 2021-08-03 DIAGNOSIS — O99613 Diseases of the digestive system complicating pregnancy, third trimester: Secondary | ICD-10-CM

## 2021-08-03 DIAGNOSIS — O99213 Obesity complicating pregnancy, third trimester: Secondary | ICD-10-CM | POA: Diagnosis not present

## 2021-08-03 DIAGNOSIS — E669 Obesity, unspecified: Secondary | ICD-10-CM

## 2021-08-03 DIAGNOSIS — Z3A32 32 weeks gestation of pregnancy: Secondary | ICD-10-CM

## 2021-08-03 DIAGNOSIS — O0990 Supervision of high risk pregnancy, unspecified, unspecified trimester: Secondary | ICD-10-CM

## 2021-08-03 DIAGNOSIS — O10919 Unspecified pre-existing hypertension complicating pregnancy, unspecified trimester: Secondary | ICD-10-CM | POA: Diagnosis present

## 2021-08-03 DIAGNOSIS — Z362 Encounter for other antenatal screening follow-up: Secondary | ICD-10-CM | POA: Diagnosis not present

## 2021-08-03 DIAGNOSIS — O99210 Obesity complicating pregnancy, unspecified trimester: Secondary | ICD-10-CM | POA: Insufficient documentation

## 2021-08-03 DIAGNOSIS — O10913 Unspecified pre-existing hypertension complicating pregnancy, third trimester: Secondary | ICD-10-CM | POA: Diagnosis not present

## 2021-08-03 DIAGNOSIS — O10013 Pre-existing essential hypertension complicating pregnancy, third trimester: Secondary | ICD-10-CM | POA: Diagnosis not present

## 2021-08-03 DIAGNOSIS — R87610 Atypical squamous cells of undetermined significance on cytologic smear of cervix (ASC-US): Secondary | ICD-10-CM

## 2021-08-03 DIAGNOSIS — K5900 Constipation, unspecified: Secondary | ICD-10-CM | POA: Insufficient documentation

## 2021-08-03 MED ORDER — DOCUSATE SODIUM 100 MG PO CAPS: 100.0000 mg | ORAL_CAPSULE | Freq: Two times a day (BID) | ORAL | 2 refills | Status: DC | PRN

## 2021-08-03 NOTE — Progress Notes (Signed)
? ?  PRENATAL VISIT NOTE ? ?Subjective:  ?Kimberly Farley is a 31 y.o. G2P1001 at [redacted]w[redacted]d being seen today for ongoing prenatal care.  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 ? ?Patient doing well with no acute concerns today. She reports  constipation .  Contractions: Irritability. Vag. Bleeding: None.  Movement: Present. Denies leaking of fluid.  ? ?The following portions of the patient's history were reviewed and updated as appropriate: allergies, current medications, past family history, past medical history, past social history, past surgical history and problem list. Problem list updated. ? ?Objective:  ? ?Vitals:  ? 08/03/21 0823  ?BP: 134/77  ?Pulse: (!) 115  ?Weight: 208 lb 9.6 oz (94.6 kg)  ? ? ?Fetal Status: Fetal Heart Rate (bpm): 150 Fundal Height: 32 cm Movement: Present    ? ?General:  Alert, oriented and cooperative. Patient is in no acute distress.  ?Skin: Skin is warm and dry. No rash noted.   ?Cardiovascular: Normal heart rate noted  ?Respiratory: Normal respiratory effort, no problems with respiration noted  ?Abdomen: Soft, gravid, appropriate for gestational age.  Pain/Pressure: Present     ?Pelvic: Cervical exam deferred        ?Extremities: Normal range of motion.  Edema: None  ?Mental Status:  Normal mood and affect. Normal behavior. Normal judgment and thought content.  ? ?Assessment and Plan:  ?Pregnancy: G2P1001 at [redacted]w[redacted]d ? ?1. [redacted] weeks gestation of pregnancy ? ? ?2. Chronic hypertension affecting pregnancy ?32 week testing starts today, pt continues with labetalol, growth scan today ? ?3.  Supervision of high risk pregnancy, antepartum ?Continue routine care ? ?4. 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 ?5. Constipation in pregnancy in third trimester ?Also advised senokot or miralax for constipation, warned of potential cramping with miralax ?- docusate sodium (COLACE) 100 MG capsule; Take 1 capsule (100 mg total) by mouth 2 (two) times daily as needed.  Dispense: 30 capsule; Refill: 2 ? ?Preterm labor symptoms and general obstetric precautions including but not limited to vaginal bleeding, contractions, leaking of fluid and fetal movement were reviewed in detail with the patient. ? ?Please refer to After Visit Summary for other counseling recommendations.  ? ?Return in about 2 weeks (around 08/17/2021) for LOB, in person. ? ? ?Mariel Aloe, MD ?Faculty Attending ?[facility] ?  ?

## 2021-08-03 NOTE — Patient Instructions (Signed)
Senokot or miralax for constipation. 

## 2021-08-10 ENCOUNTER — Other Ambulatory Visit: Payer: Self-pay | Admitting: *Deleted

## 2021-08-10 ENCOUNTER — Ambulatory Visit: Payer: Medicaid Other | Attending: Maternal & Fetal Medicine

## 2021-08-10 ENCOUNTER — Ambulatory Visit: Payer: Medicaid Other | Admitting: *Deleted

## 2021-08-10 ENCOUNTER — Encounter: Payer: Self-pay | Admitting: *Deleted

## 2021-08-10 VITALS — BP 116/69 | HR 109

## 2021-08-10 DIAGNOSIS — O99213 Obesity complicating pregnancy, third trimester: Secondary | ICD-10-CM | POA: Diagnosis not present

## 2021-08-10 DIAGNOSIS — O10013 Pre-existing essential hypertension complicating pregnancy, third trimester: Secondary | ICD-10-CM

## 2021-08-10 DIAGNOSIS — O10919 Unspecified pre-existing hypertension complicating pregnancy, unspecified trimester: Secondary | ICD-10-CM | POA: Insufficient documentation

## 2021-08-10 DIAGNOSIS — Z3A33 33 weeks gestation of pregnancy: Secondary | ICD-10-CM

## 2021-08-10 DIAGNOSIS — O0990 Supervision of high risk pregnancy, unspecified, unspecified trimester: Secondary | ICD-10-CM | POA: Insufficient documentation

## 2021-08-10 DIAGNOSIS — O10913 Unspecified pre-existing hypertension complicating pregnancy, third trimester: Secondary | ICD-10-CM

## 2021-08-10 DIAGNOSIS — E669 Obesity, unspecified: Secondary | ICD-10-CM

## 2021-08-10 DIAGNOSIS — O99210 Obesity complicating pregnancy, unspecified trimester: Secondary | ICD-10-CM | POA: Diagnosis present

## 2021-08-17 ENCOUNTER — Encounter: Payer: Self-pay | Admitting: *Deleted

## 2021-08-17 ENCOUNTER — Ambulatory Visit: Payer: Medicaid Other | Attending: Maternal & Fetal Medicine

## 2021-08-17 ENCOUNTER — Ambulatory Visit: Payer: Medicaid Other | Admitting: *Deleted

## 2021-08-17 VITALS — BP 129/70 | HR 105

## 2021-08-17 DIAGNOSIS — O99213 Obesity complicating pregnancy, third trimester: Secondary | ICD-10-CM

## 2021-08-17 DIAGNOSIS — Z3A34 34 weeks gestation of pregnancy: Secondary | ICD-10-CM

## 2021-08-17 DIAGNOSIS — Z362 Encounter for other antenatal screening follow-up: Secondary | ICD-10-CM

## 2021-08-17 DIAGNOSIS — O10913 Unspecified pre-existing hypertension complicating pregnancy, third trimester: Secondary | ICD-10-CM | POA: Diagnosis not present

## 2021-08-17 DIAGNOSIS — O10919 Unspecified pre-existing hypertension complicating pregnancy, unspecified trimester: Secondary | ICD-10-CM | POA: Insufficient documentation

## 2021-08-17 DIAGNOSIS — E669 Obesity, unspecified: Secondary | ICD-10-CM

## 2021-08-17 DIAGNOSIS — O0990 Supervision of high risk pregnancy, unspecified, unspecified trimester: Secondary | ICD-10-CM

## 2021-08-17 DIAGNOSIS — O99210 Obesity complicating pregnancy, unspecified trimester: Secondary | ICD-10-CM

## 2021-08-17 DIAGNOSIS — O10013 Pre-existing essential hypertension complicating pregnancy, third trimester: Secondary | ICD-10-CM

## 2021-08-19 ENCOUNTER — Ambulatory Visit (INDEPENDENT_AMBULATORY_CARE_PROVIDER_SITE_OTHER): Payer: Medicaid Other | Admitting: Obstetrics and Gynecology

## 2021-08-19 VITALS — BP 134/79 | HR 107 | Wt 209.9 lb

## 2021-08-19 DIAGNOSIS — Z6838 Body mass index (BMI) 38.0-38.9, adult: Secondary | ICD-10-CM | POA: Insufficient documentation

## 2021-08-19 DIAGNOSIS — R87610 Atypical squamous cells of undetermined significance on cytologic smear of cervix (ASC-US): Secondary | ICD-10-CM

## 2021-08-19 DIAGNOSIS — O0990 Supervision of high risk pregnancy, unspecified, unspecified trimester: Secondary | ICD-10-CM

## 2021-08-19 DIAGNOSIS — O10919 Unspecified pre-existing hypertension complicating pregnancy, unspecified trimester: Secondary | ICD-10-CM

## 2021-08-19 DIAGNOSIS — Z3A35 35 weeks gestation of pregnancy: Secondary | ICD-10-CM

## 2021-08-19 DIAGNOSIS — O99210 Obesity complicating pregnancy, unspecified trimester: Secondary | ICD-10-CM

## 2021-08-19 NOTE — Progress Notes (Signed)
? ? ?  PRENATAL VISIT NOTE ? ?Subjective:  ?Kimberly Farley is a 31 y.o. G2P1001 at [redacted]w[redacted]d being seen today for ongoing prenatal care.  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 ? ?Patient reports no complaints.  Contractions: Not present. Vag. Bleeding: None.  Movement: Present. Denies leaking of fluid.  ? ?The following portions of the patient's history were reviewed and updated as appropriate: allergies, current medications, past family history, past medical history, past social history, past surgical history and problem list.  ? ?Objective:  ? ?Vitals:  ? 08/19/21 1034  ?BP: 134/79  ?Pulse: (!) 107  ?Weight: 209 lb 14.4 oz (95.2 kg)  ? ? ?Fetal Status: Fetal Heart Rate (bpm): 154   Movement: Present    ? ?General:  Alert, oriented and cooperative. Patient is in no acute distress.  ?Skin: Skin is warm and dry. No rash noted.   ?Cardiovascular: Normal heart rate noted  ?Respiratory: Normal respiratory effort, no problems with respiration noted  ?Abdomen: Soft, gravid, appropriate for gestational age.  Pain/Pressure: Absent     ?Pelvic: Cervical exam deferred        ?Extremities: Normal range of motion.     ?Mental Status: Normal mood and affect. Normal behavior. Normal judgment and thought content.  ? ?Assessment and Plan:  ?Pregnancy: G2P1001 at [redacted]w[redacted]d ?1. [redacted] weeks gestation of pregnancy ?GBS next visit ?D/w her and she would like a BTL. Papers signed today. D/w her re: 30d wait but since she's doing so well that 39wk delivery may be fine. F/u at next growth u/s on 4/24 ? ?2. Chronic hypertension  affecting pregnancy ?Confirms on labetalol 200 bid. ?See above. Continue with weekly testing ?[id number]: afi 14, cephalic, 8/8 ?99991111: 0000000, 2143gm, ac 65%, afi 14 ? ?3. Supervision of high risk pregnancy, antepartum ? ?4. Obesity in pregnancy, antepartum ? ?5. BMI 38.0-38.9,adult ? ?6. 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 ?Preterm labor symptoms and general obstetric precautions including but not limited to vaginal bleeding, contractions, leaking of fluid and fetal movement were reviewed in detail with the patient. ?Please refer to After Visit Summary for other counseling recommendations.  ? ?No follow-ups on file. ? ?Future Appointments  ?Date Time Provider [place]  ?08/24/2021  9:30 AM [facility] NURSE [facility]  ?08/24/2021  9:45 AM [facility] US5 [facility]  ?08/31/2021  8:15 AM [facility] NURSE [facility]  ?08/31/2021  8:30 AM [facility] US2 [facility]  ?09/02/2021 10:55 AM Griffin Basil, MD [facility]  ?09/08/2021  8:15 AM [facility] NURSE [facility]  ?09/08/2021  8:30 AM [facility] US2 [facility]  ? ? ?Aletha Halim, MD ? ?

## 2021-08-21 ENCOUNTER — Encounter: Payer: Medicaid Other | Admitting: Obstetrics and Gynecology

## 2021-08-21 ENCOUNTER — Encounter: Payer: Self-pay | Admitting: *Deleted

## 2021-08-24 ENCOUNTER — Ambulatory Visit: Payer: Medicaid Other | Admitting: *Deleted

## 2021-08-24 ENCOUNTER — Encounter: Payer: Self-pay | Admitting: *Deleted

## 2021-08-24 ENCOUNTER — Ambulatory Visit: Payer: Medicaid Other | Attending: Obstetrics and Gynecology

## 2021-08-24 VITALS — BP 130/83 | HR 113

## 2021-08-24 DIAGNOSIS — O10919 Unspecified pre-existing hypertension complicating pregnancy, unspecified trimester: Secondary | ICD-10-CM | POA: Diagnosis present

## 2021-08-24 DIAGNOSIS — Z3A35 35 weeks gestation of pregnancy: Secondary | ICD-10-CM

## 2021-08-24 DIAGNOSIS — O0990 Supervision of high risk pregnancy, unspecified, unspecified trimester: Secondary | ICD-10-CM

## 2021-08-24 DIAGNOSIS — O99210 Obesity complicating pregnancy, unspecified trimester: Secondary | ICD-10-CM

## 2021-08-24 DIAGNOSIS — E669 Obesity, unspecified: Secondary | ICD-10-CM

## 2021-08-24 DIAGNOSIS — O99213 Obesity complicating pregnancy, third trimester: Secondary | ICD-10-CM | POA: Diagnosis not present

## 2021-08-24 DIAGNOSIS — O10913 Unspecified pre-existing hypertension complicating pregnancy, third trimester: Secondary | ICD-10-CM | POA: Diagnosis present

## 2021-08-24 DIAGNOSIS — O10013 Pre-existing essential hypertension complicating pregnancy, third trimester: Secondary | ICD-10-CM | POA: Diagnosis not present

## 2021-08-31 ENCOUNTER — Encounter: Payer: Self-pay | Admitting: *Deleted

## 2021-08-31 ENCOUNTER — Ambulatory Visit: Payer: Medicaid Other | Attending: Obstetrics

## 2021-08-31 ENCOUNTER — Other Ambulatory Visit: Payer: Self-pay | Admitting: *Deleted

## 2021-08-31 ENCOUNTER — Ambulatory Visit: Payer: Medicaid Other | Admitting: *Deleted

## 2021-08-31 VITALS — BP 116/86 | HR 106

## 2021-08-31 DIAGNOSIS — Z3A36 36 weeks gestation of pregnancy: Secondary | ICD-10-CM

## 2021-08-31 DIAGNOSIS — E669 Obesity, unspecified: Secondary | ICD-10-CM | POA: Diagnosis not present

## 2021-08-31 DIAGNOSIS — O99210 Obesity complicating pregnancy, unspecified trimester: Secondary | ICD-10-CM

## 2021-08-31 DIAGNOSIS — O10913 Unspecified pre-existing hypertension complicating pregnancy, third trimester: Secondary | ICD-10-CM

## 2021-08-31 DIAGNOSIS — O10919 Unspecified pre-existing hypertension complicating pregnancy, unspecified trimester: Secondary | ICD-10-CM | POA: Insufficient documentation

## 2021-08-31 DIAGNOSIS — O0990 Supervision of high risk pregnancy, unspecified, unspecified trimester: Secondary | ICD-10-CM | POA: Diagnosis present

## 2021-08-31 DIAGNOSIS — O99213 Obesity complicating pregnancy, third trimester: Secondary | ICD-10-CM

## 2021-08-31 DIAGNOSIS — O10013 Pre-existing essential hypertension complicating pregnancy, third trimester: Secondary | ICD-10-CM

## 2021-08-31 DIAGNOSIS — Z362 Encounter for other antenatal screening follow-up: Secondary | ICD-10-CM | POA: Diagnosis not present

## 2021-09-02 ENCOUNTER — Other Ambulatory Visit (HOSPITAL_COMMUNITY)
Admission: RE | Admit: 2021-09-02 | Discharge: 2021-09-02 | Disposition: A | Discharge status: Home / Self Care | Payer: Medicaid Other | Source: Ambulatory Visit | Attending: Obstetrics and Gynecology | Admitting: Obstetrics and Gynecology

## 2021-09-02 ENCOUNTER — Ambulatory Visit (INDEPENDENT_AMBULATORY_CARE_PROVIDER_SITE_OTHER): Payer: Medicaid Other | Admitting: Obstetrics and Gynecology

## 2021-09-02 VITALS — BP 138/86 | HR 105 | Wt 213.9 lb

## 2021-09-02 DIAGNOSIS — O0990 Supervision of high risk pregnancy, unspecified, unspecified trimester: Secondary | ICD-10-CM | POA: Diagnosis not present

## 2021-09-02 DIAGNOSIS — O10919 Unspecified pre-existing hypertension complicating pregnancy, unspecified trimester: Secondary | ICD-10-CM

## 2021-09-02 DIAGNOSIS — Z3A37 37 weeks gestation of pregnancy: Secondary | ICD-10-CM

## 2021-09-02 DIAGNOSIS — O99210 Obesity complicating pregnancy, unspecified trimester: Secondary | ICD-10-CM

## 2021-09-02 LAB — OB RESULTS CONSOLE GC/CHLAMYDIA: Gonorrhea: NEGATIVE

## 2021-09-02 NOTE — Addendum Note (Signed)
Addended by: Warden Fillers on: 09/02/2021 12:17 PM ? ? Modules accepted: Orders ? ?

## 2021-09-02 NOTE — Progress Notes (Signed)
? ?  PRENATAL VISIT NOTE ? ?Subjective:  ?Kimberly Farley is a 31 y.o. G2P1001 at [redacted]w[redacted]d being seen today for ongoing prenatal care.  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 ? ?Patient doing well with no acute concerns today. She reports no complaints.  Contractions: Not present. Vag. Bleeding: None.  Movement: Present. Denies leaking of fluid.  ? ?The following portions of the patient's history were reviewed and updated as appropriate: allergies, current medications, past family history, past medical history, past social history, past surgical history and problem list. Problem list updated. ? ?Objective:  ? ?Vitals:  ? 09/02/21 1112  ?BP: 138/86  ?Pulse: (!) 105  ?Weight: 97 kg  ? ? ?Fetal Status: Fetal Heart Rate (bpm): 138   Movement: Present    ? ?General:  Alert, oriented and cooperative. Patient is in no acute distress.  ?Skin: Skin is warm and dry. No rash noted.   ?Cardiovascular: Normal heart rate noted  ?Respiratory: Normal respiratory effort, no problems with respiration noted  ?Abdomen: Soft, gravid, appropriate for gestational age.  Pain/Pressure: Present     ?Pelvic: Cervical exam performed Dilation: Closed Effacement (%): 20 Station: -3  ?Extremities: Normal range of motion.  Edema: None  ?Mental Status:  Normal mood and affect. Normal behavior. Normal judgment and thought content.  ? ?Assessment and Plan:  ?Pregnancy: G2P1001 at [redacted]w[redacted]d ? ?1. Supervision of high risk pregnancy, antepartum ?Continue routine prenatal ?- GC/Chlamydia probe amp ([place])not at [facility] ?- Strep Gp B  Culture+Rflx ? ?2. [redacted] weeks gestation of pregnancy ? ? ?3. Chronic hypertension affecting pregnancy ?Continue weekly BPP/NST ?Delivery at 39 weeks per guidelines ? ?4. Obesity in pregnancy, antepartum ? ? ?Term labor symptoms and general obstetric precautions including but not limited to vaginal bleeding, contractions, leaking of fluid and fetal movement were reviewed in detail with the patient. ? ?Please refer to After Visit Summary for other counseling recommendations.  ? ?Return in about 1 week (around 09/09/2021) for in person, [facility]. ? ? ?Lynnda Shields, MD ?Faculty Attending ?[facility] [place] ?  ?

## 2021-09-03 LAB — GC/CHLAMYDIA PROBE AMP (~~LOC~~) NOT AT ARMC
Chlamydia: NEGATIVE
Comment: NEGATIVE
Comment: NORMAL
Neisseria Gonorrhea: NEGATIVE

## 2021-09-06 ENCOUNTER — Other Ambulatory Visit: Payer: Self-pay | Admitting: Advanced Practice Midwife

## 2021-09-06 LAB — STREP GP B CULTURE+RFLX: Strep Gp B Culture+Rflx: NEGATIVE

## 2021-09-08 ENCOUNTER — Encounter: Payer: Self-pay | Admitting: *Deleted

## 2021-09-08 ENCOUNTER — Encounter (HOSPITAL_COMMUNITY): Payer: Self-pay

## 2021-09-08 ENCOUNTER — Ambulatory Visit: Payer: Medicaid Other | Attending: Obstetrics

## 2021-09-08 ENCOUNTER — Telehealth (HOSPITAL_COMMUNITY): Payer: Self-pay | Admitting: *Deleted

## 2021-09-08 ENCOUNTER — Ambulatory Visit (INDEPENDENT_AMBULATORY_CARE_PROVIDER_SITE_OTHER): Payer: Medicaid Other | Admitting: Obstetrics and Gynecology

## 2021-09-08 ENCOUNTER — Ambulatory Visit: Payer: Medicaid Other | Admitting: *Deleted

## 2021-09-08 ENCOUNTER — Encounter: Payer: Self-pay | Admitting: Obstetrics and Gynecology

## 2021-09-08 VITALS — BP 119/69 | HR 117

## 2021-09-08 VITALS — Wt 214.0 lb

## 2021-09-08 DIAGNOSIS — E669 Obesity, unspecified: Secondary | ICD-10-CM

## 2021-09-08 DIAGNOSIS — O10919 Unspecified pre-existing hypertension complicating pregnancy, unspecified trimester: Secondary | ICD-10-CM | POA: Insufficient documentation

## 2021-09-08 DIAGNOSIS — O0990 Supervision of high risk pregnancy, unspecified, unspecified trimester: Secondary | ICD-10-CM | POA: Diagnosis present

## 2021-09-08 DIAGNOSIS — O10013 Pre-existing essential hypertension complicating pregnancy, third trimester: Secondary | ICD-10-CM | POA: Diagnosis not present

## 2021-09-08 DIAGNOSIS — O10913 Unspecified pre-existing hypertension complicating pregnancy, third trimester: Secondary | ICD-10-CM | POA: Diagnosis not present

## 2021-09-08 DIAGNOSIS — Z362 Encounter for other antenatal screening follow-up: Secondary | ICD-10-CM | POA: Diagnosis not present

## 2021-09-08 DIAGNOSIS — O99210 Obesity complicating pregnancy, unspecified trimester: Secondary | ICD-10-CM | POA: Diagnosis present

## 2021-09-08 DIAGNOSIS — Z3A37 37 weeks gestation of pregnancy: Secondary | ICD-10-CM

## 2021-09-08 DIAGNOSIS — O99213 Obesity complicating pregnancy, third trimester: Secondary | ICD-10-CM | POA: Insufficient documentation

## 2021-09-08 DIAGNOSIS — Z3009 Encounter for other general counseling and advice on contraception: Secondary | ICD-10-CM

## 2021-09-08 DIAGNOSIS — Z309 Encounter for contraceptive management, unspecified: Secondary | ICD-10-CM | POA: Insufficient documentation

## 2021-09-08 NOTE — Telephone Encounter (Signed)
Preadmission screen  

## 2021-09-08 NOTE — Progress Notes (Signed)
Subjective:  ?Kimberly Farley is a 31 y.o. G2P1001 at [redacted]w[redacted]d being seen today for ongoing prenatal care.  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 ? ?Patient reports General discomforts of pregnancy.   .  .  Movement: Present. Denies leaking of fluid.  ? ?The following portions of the patient's history were reviewed and updated as appropriate: allergies, current medications, past family history, past medical history, past social history, past surgical history and problem list. Problem list updated. ? ?Objective:  ? ?Vitals:  ? 09/08/21 1012  ?Weight: 214 lb (97.1 kg)  ? ? ?Fetal Status: Fetal Heart Rate (bpm): 143   Movement: Present    ? ?General:  Alert, oriented and cooperative. Patient is in no acute distress.  ?Skin: Skin is warm and dry. No rash noted.   ?Cardiovascular: Normal heart rate noted  ?Respiratory: Normal respiratory effort, no problems with respiration noted  ?Abdomen: Soft, gravid, appropriate for gestational age. Pain/Pressure: Absent     ?Pelvic:  Cervical exam deferred        ?Extremities: Normal range of motion.     ?Mental Status: Normal mood and affect. Normal behavior. Normal judgment and thought content.  ? ?Urinalysis:     ? ?Assessment and Plan:  ?Pregnancy: G2P1001 at [redacted]w[redacted]d ? ?1. Supervision of high risk pregnancy, antepartum ?Stable ?Labor precautions ?IOL scheduled ? ?2. Unwanted fertility ?BTL papers signed ? ?3. CHTN ?BP stable ?Continue with current management ?Serial growth scans and antenatal testing as per protocol. ?IOL scheduled ?Term labor  symptoms and general obstetric precautions including but not limited to vaginal bleeding, contractions, leaking of fluid and fetal movement were reviewed in detail with the patient. ?Please refer to After Visit Summary for other counseling recommendations.  ?Return in about 1 week (around 09/15/2021) for OB visit, face to face, any provider. ? ? ?Chancy Milroy, MD ?

## 2021-09-08 NOTE — Patient Instructions (Signed)
Vaginal Delivery  Vaginal delivery means that you give birth by pushing your baby out of your birth canal (vagina). Your health care team will help you before, during, and after vaginal delivery. Birth experiences are unique for every woman and every pregnancy, and birth experiences vary depending on where you choose to give birth. What are the risks and benefits? Generally, this is safe. However, problems may occur, including: Bleeding. Infection. Damage to other structures such as vaginal tearing. Allergic reactions to medicines. Despite the risks, benefits of vaginal delivery include less risk of bleeding and infection and a shorter recovery time compared to a Cesarean delivery. Cesarean delivery, or C-section, is the surgical delivery of a baby. What happens when I arrive at the birth center or hospital? Once you are in labor and have been admitted into the hospital or birth center, your health care team may: Review your pregnancy history and any concerns that you have. Talk with you about your birth plan and discuss pain control options. Check your blood pressure, breathing, and heartbeat. Assess your baby's heartbeat. Monitor your uterus for contractions. Check whether your bag of water (amniotic sac) has broken (ruptured). Insert an IV into one of your veins. This may be used to give you fluids and medicines. Monitoring Your health care team may assess your contractions (uterine monitoring) and your baby's heart rate (fetal monitoring). You may need to be monitored: Often, but not continuously (intermittently). All the time or for long periods at a time (continuously). Continuous monitoring may be needed if: You are taking certain medicines, such as medicine to relieve pain or make your contractions stronger. You have pregnancy or labor complications. Monitoring may be done by: Placing a special stethoscope or a handheld monitoring device on your abdomen to check your baby's  heartbeat and to check for contractions. Placing monitors on your abdomen (external monitors) to record your baby's heartbeat and the frequency and length of contractions. Placing monitors inside your uterus through your vagina (internal monitors) to record your baby's heartbeat and the frequency, length, and strength of your contractions. Depending on the type of monitor, it may remain in your uterus or on your baby's head until birth. Telemetry. This is a type of continuous monitoring that can be done with external or internal monitors. Instead of having to stay in bed, you are able to move around. Physical exam Your health care team may perform frequent physical exams. This may include: Checking how and where your baby is positioned in your uterus. Checking your cervix to determine: Whether it is thinning out (effacing). Whether it is opening up (dilating). What happens during labor and delivery?  Normal labor and delivery is divided into the following three stages: Stage 1 This is the longest stage of labor. Throughout this stage, you will feel contractions. Contractions generally feel mild, infrequent, and irregular at first. They get stronger, more frequent, and more regular as you move through this stage. You may have contractions about every 2-3 minutes. This stage ends when your cervix is completely dilated to 4 inches (10 cm) and completely effaced. Stage 2 This stage starts once your cervix is completely effaced and dilated and lasts until the delivery of your baby. This is the stage where you will feel an urge to push your baby out of your vagina. You may feel stretching and burning pain, especially when the widest part of your baby's head passes through the vaginal opening (crowning). Once your baby is delivered, the umbilical cord will be   clamped and cut. Timing of cutting the cord will depend on your wishes, your baby's health, and your health care provider's practices. Your baby  will be placed on your bare chest (skin-to-skin contact) in an upright position and covered with a warm blanket. If you are choosing to breastfeed, watch your baby for feeding cues, like rooting or sucking, and help the baby to your breast for his or her first feeding. Stage 3 This stage starts immediately after the birth of your baby and ends after you deliver the placenta. This stage may take anywhere from 5 to 30 minutes. After your baby has been delivered, you will feel contractions as your body expels the placenta. These contractions also help your uterus get smaller and reduce bleeding. What can I expect after labor and delivery? After labor is over, you and your baby will be assessed closely until you are ready to go home. Your health care team will teach you how to care for yourself and your baby. You and your baby may be encouraged to stay in the same room (rooming in) during your hospital stay. This will help promote early bonding and successful breastfeeding. Your uterus will be checked and massaged regularly (fundal massage). You may continue to receive fluids and medicines through an IV. You will have some soreness and pain in your abdomen, vagina, and the area of skin between your vaginal opening and your anus (perineum). If an incision was made near your vagina (episiotomy) or if you had some vaginal tearing during delivery, cold compresses may be placed on your episiotomy or your tear. This helps to reduce pain and swelling. It is normal to have vaginal bleeding after delivery. Wear a sanitary pad for vaginal bleeding and discharge. Summary Vaginal delivery means that you will give birth by pushing your baby out of your birth canal (vagina). Your health care team will monitor you and your baby throughout the stages of labor. After you deliver your baby, your health care team will continue to assess you and your baby to ensure you are both recovering as expected after delivery. This  information is not intended to replace advice given to you by your health care provider. Make sure you discuss any questions you have with your health care provider. Document Revised: 03/24/2020 Document Reviewed: 03/24/2020 Elsevier Patient Education  2023 [employer]  

## 2021-09-09 ENCOUNTER — Telehealth (HOSPITAL_COMMUNITY): Payer: Self-pay | Admitting: *Deleted

## 2021-09-09 NOTE — Telephone Encounter (Signed)
Preadmission screen  

## 2021-09-10 ENCOUNTER — Telehealth (HOSPITAL_COMMUNITY): Payer: Self-pay | Admitting: *Deleted

## 2021-09-10 NOTE — Telephone Encounter (Signed)
Preadmission screen  

## 2021-09-11 ENCOUNTER — Telehealth (HOSPITAL_COMMUNITY): Payer: Self-pay | Admitting: *Deleted

## 2021-09-11 ENCOUNTER — Encounter (HOSPITAL_COMMUNITY): Payer: Self-pay | Admitting: *Deleted

## 2021-09-11 NOTE — Telephone Encounter (Signed)
Preadmission screen  

## 2021-09-14 ENCOUNTER — Ambulatory Visit: Payer: Medicaid Other | Admitting: *Deleted

## 2021-09-14 ENCOUNTER — Ambulatory Visit: Payer: Medicaid Other | Attending: Obstetrics

## 2021-09-14 VITALS — BP 126/80 | HR 104

## 2021-09-14 DIAGNOSIS — O10913 Unspecified pre-existing hypertension complicating pregnancy, third trimester: Secondary | ICD-10-CM | POA: Insufficient documentation

## 2021-09-14 DIAGNOSIS — Z3A38 38 weeks gestation of pregnancy: Secondary | ICD-10-CM

## 2021-09-14 DIAGNOSIS — O0990 Supervision of high risk pregnancy, unspecified, unspecified trimester: Secondary | ICD-10-CM

## 2021-09-14 DIAGNOSIS — O10919 Unspecified pre-existing hypertension complicating pregnancy, unspecified trimester: Secondary | ICD-10-CM | POA: Diagnosis present

## 2021-09-14 DIAGNOSIS — Z362 Encounter for other antenatal screening follow-up: Secondary | ICD-10-CM | POA: Diagnosis not present

## 2021-09-14 DIAGNOSIS — O99210 Obesity complicating pregnancy, unspecified trimester: Secondary | ICD-10-CM

## 2021-09-14 DIAGNOSIS — O10013 Pre-existing essential hypertension complicating pregnancy, third trimester: Secondary | ICD-10-CM | POA: Diagnosis not present

## 2021-09-14 DIAGNOSIS — O99213 Obesity complicating pregnancy, third trimester: Secondary | ICD-10-CM

## 2021-09-15 ENCOUNTER — Ambulatory Visit (INDEPENDENT_AMBULATORY_CARE_PROVIDER_SITE_OTHER): Payer: Medicaid Other | Admitting: Family Medicine

## 2021-09-15 ENCOUNTER — Other Ambulatory Visit: Payer: Self-pay | Admitting: Women's Health

## 2021-09-15 VITALS — BP 112/83 | HR 97 | Wt 214.1 lb

## 2021-09-15 DIAGNOSIS — O0990 Supervision of high risk pregnancy, unspecified, unspecified trimester: Secondary | ICD-10-CM

## 2021-09-15 DIAGNOSIS — O10919 Unspecified pre-existing hypertension complicating pregnancy, unspecified trimester: Secondary | ICD-10-CM

## 2021-09-15 DIAGNOSIS — Z3009 Encounter for other general counseling and advice on contraception: Secondary | ICD-10-CM

## 2021-09-15 DIAGNOSIS — O99210 Obesity complicating pregnancy, unspecified trimester: Secondary | ICD-10-CM

## 2021-09-15 NOTE — Patient Instructions (Signed)

## 2021-09-15 NOTE — Progress Notes (Signed)
? ?  Subjective:  ?Kimberly Farley is a 31 y.o. G2P1001 at [redacted]w[redacted]d being seen today for ongoing prenatal care.  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 ? ?Patient reports no complaints.  Contractions: Irregular. Vag. Bleeding: None.  Movement: Present. Denies leaking of fluid.  ? ?The following portions of the patient's history were reviewed and updated as appropriate: allergies, current medications, past family history, past medical history, past social history, past surgical history and problem list. Problem list updated. ? ?Objective:  ? ?Vitals:  ? 09/15/21 1033  ?BP: 112/83  ?Pulse: 97  ?Weight: 214 lb 1.6 oz (97.1 kg)  ? ? ?Fetal Status: Fetal Heart Rate (bpm): 148   Movement: Present    ? ?General:  Alert, oriented and cooperative. Patient is in no acute distress.  ?Skin: Skin is warm and dry. No rash noted.   ?Cardiovascular: Normal heart rate noted  ?Respiratory: Normal respiratory effort, no problems with respiration noted  ?Abdomen: Soft, gravid, appropriate for gestational age. Pain/Pressure: Present     ?Pelvic: Vag. Bleeding: None     ?Cervical exam deferred        ?Extremities: Normal range of motion.     ?Mental Status: Normal mood and affect. Normal behavior. Normal judgment and thought content.  ? ?Urinalysis:     ? ?Assessment and Plan:  ?Pregnancy: G2P1001 at [redacted]w[redacted]d ? ?1. Supervision of high risk pregnancy, antepartum ?BP and FHR normal ? ?2. Chronic hypertension affecting pregnancy ?Normotensive on labetalol and ASA ?Scheduled for IOL tomorrow ?Discussed pushing  back a few days so she could get PP tubal in hospital (only signed on 08/19/2021) but prefers interval tubal ? ?3. Obesity in pregnancy, antepartum ? ? ?4. Unwanted fertility ?Papers signed 08/19/2021 ?See above ? ?Term labor symptoms and general obstetric precautions including but not limited to vaginal bleeding, contractions, leaking of fluid and fetal movement were reviewed in detail with the patient. ?Please refer to After Visit Summary for other counseling recommendations.  ?Return in 6 weeks (on 10/27/2021). ? ? ?Clarnce Flock, MD ? ?

## 2021-09-16 ENCOUNTER — Inpatient Hospital Stay (HOSPITAL_COMMUNITY): Payer: Medicaid Other

## 2021-09-16 ENCOUNTER — Inpatient Hospital Stay (HOSPITAL_COMMUNITY)
Admission: AD | Admit: 2021-09-16 | Discharge: 2021-09-18 | DRG: 807 | Disposition: A | Discharge status: Home / Self Care | Payer: Medicaid Other | Attending: Obstetrics and Gynecology | Admitting: Obstetrics and Gynecology

## 2021-09-16 ENCOUNTER — Other Ambulatory Visit: Payer: Self-pay

## 2021-09-16 ENCOUNTER — Encounter (HOSPITAL_COMMUNITY): Payer: Self-pay | Admitting: Obstetrics and Gynecology

## 2021-09-16 DIAGNOSIS — Z880 Allergy status to penicillin: Secondary | ICD-10-CM | POA: Diagnosis not present

## 2021-09-16 DIAGNOSIS — R7303 Prediabetes: Secondary | ICD-10-CM | POA: Diagnosis present

## 2021-09-16 DIAGNOSIS — R87610 Atypical squamous cells of undetermined significance on cytologic smear of cervix (ASC-US): Secondary | ICD-10-CM | POA: Diagnosis present

## 2021-09-16 DIAGNOSIS — O99210 Obesity complicating pregnancy, unspecified trimester: Secondary | ICD-10-CM | POA: Diagnosis present

## 2021-09-16 DIAGNOSIS — Z3009 Encounter for other general counseling and advice on contraception: Secondary | ICD-10-CM | POA: Diagnosis present

## 2021-09-16 DIAGNOSIS — O99214 Obesity complicating childbirth: Secondary | ICD-10-CM | POA: Diagnosis present

## 2021-09-16 DIAGNOSIS — Z3A39 39 weeks gestation of pregnancy: Secondary | ICD-10-CM

## 2021-09-16 DIAGNOSIS — O99892 Other specified diseases and conditions complicating childbirth: Secondary | ICD-10-CM | POA: Diagnosis present

## 2021-09-16 DIAGNOSIS — Z309 Encounter for contraceptive management, unspecified: Secondary | ICD-10-CM | POA: Diagnosis present

## 2021-09-16 DIAGNOSIS — O1002 Pre-existing essential hypertension complicating childbirth: Principal | ICD-10-CM | POA: Diagnosis present

## 2021-09-16 DIAGNOSIS — I10 Essential (primary) hypertension: Secondary | ICD-10-CM | POA: Diagnosis present

## 2021-09-16 DIAGNOSIS — O10913 Unspecified pre-existing hypertension complicating pregnancy, third trimester: Secondary | ICD-10-CM | POA: Insufficient documentation

## 2021-09-16 DIAGNOSIS — O10919 Unspecified pre-existing hypertension complicating pregnancy, unspecified trimester: Secondary | ICD-10-CM | POA: Diagnosis present

## 2021-09-16 DIAGNOSIS — O0990 Supervision of high risk pregnancy, unspecified, unspecified trimester: Secondary | ICD-10-CM

## 2021-09-16 LAB — COMPREHENSIVE METABOLIC PANEL
ALT: 26 U/L (ref 0–44)
AST: 25 U/L (ref 15–41)
Albumin: 2.8 g/dL — ABNORMAL LOW (ref 3.5–5.0)
Alkaline Phosphatase: 113 U/L (ref 38–126)
Anion gap: 8 (ref 5–15)
BUN: 7 mg/dL (ref 6–20)
CO2: 18 mmol/L — ABNORMAL LOW (ref 22–32)
Calcium: 9.4 mg/dL (ref 8.9–10.3)
Chloride: 109 mmol/L (ref 98–111)
Creatinine, Ser: 0.6 mg/dL (ref 0.44–1.00)
GFR, Estimated: >60 mL/min (ref >60)
Glucose, Bld: 90 mg/dL (ref 70–99)
Potassium: 3.7 mmol/L (ref 3.5–5.1)
Sodium: 135 mmol/L (ref 135–145)
Total Bilirubin: 0.6 mg/dL (ref 0.3–1.2)
Total Protein: 6.6 g/dL (ref 6.5–8.1)

## 2021-09-16 LAB — PROTEIN / CREATININE RATIO, URINE
Creatinine, Urine: 174.52 mg/dL
Protein Creatinine Ratio: 0.11 mg/mg{Cre} (ref 0.00–0.15)
Total Protein, Urine: 20 mg/dL

## 2021-09-16 LAB — TYPE AND SCREEN
ABO/RH(D): O POS
Antibody Screen: NEGATIVE

## 2021-09-16 LAB — CBC
HCT: 39.3 % (ref 36.0–46.0)
Hemoglobin: 13.6 g/dL (ref 12.0–15.0)
MCH: 31.8 pg (ref 26.0–34.0)
MCHC: 34.6 g/dL (ref 30.0–36.0)
MCV: 91.8 fL (ref 80.0–100.0)
Platelets: 309 10*3/uL (ref 150–400)
RBC: 4.28 MIL/uL (ref 3.87–5.11)
RDW: 13.9 % (ref 11.5–15.5)
WBC: 11.5 10*3/uL — ABNORMAL HIGH (ref 4.0–10.5)
nRBC: 0 % (ref 0.0–0.2)

## 2021-09-16 LAB — RPR: RPR Ser Ql: NONREACTIVE

## 2021-09-16 MED ORDER — OXYTOCIN-SODIUM CHLORIDE 30-0.9 UT/500ML-% IV SOLN: 2.5000 [IU]/h | INTRAVENOUS | Status: DC

## 2021-09-16 MED ORDER — LACTATED RINGERS IV SOLN: 500.0000 mL | INTRAVENOUS | Status: DC | PRN

## 2021-09-16 MED ORDER — LIDOCAINE HCL (PF) 1 % IJ SOLN
30.0000 mL | INTRAMUSCULAR | Status: AC | PRN
  Administered 2021-09-16: 30 mL via SUBCUTANEOUS
  Filled 2021-09-16: qty 30

## 2021-09-16 MED ORDER — TERBUTALINE SULFATE 1 MG/ML IJ SOLN: 0.2500 mg | Freq: Once | INTRAMUSCULAR | Status: DC | PRN

## 2021-09-16 MED ORDER — ONDANSETRON HCL 4 MG/2ML IJ SOLN: 4.0000 mg | Freq: Four times a day (QID) | INTRAMUSCULAR | Status: DC | PRN

## 2021-09-16 MED ORDER — OXYTOCIN BOLUS FROM INFUSION
333.0000 mL | Freq: Once | INTRAVENOUS | Status: AC
  Administered 2021-09-16: 333 mL via INTRAVENOUS

## 2021-09-16 MED ORDER — LACTATED RINGERS IV SOLN: INTRAVENOUS | Status: DC

## 2021-09-16 MED ORDER — MISOPROSTOL 25 MCG QUARTER TABLET: 25.0000 ug | ORAL_TABLET | ORAL | Status: DC | PRN

## 2021-09-16 MED ORDER — ACETAMINOPHEN 325 MG PO TABS: 650.0000 mg | ORAL_TABLET | ORAL | Status: DC | PRN

## 2021-09-16 MED ORDER — FENTANYL CITRATE (PF) 100 MCG/2ML IJ SOLN
50.0000 ug | INTRAMUSCULAR | Status: DC | PRN
  Administered 2021-09-16: 100 ug via INTRAVENOUS
  Filled 2021-09-16: qty 2

## 2021-09-16 MED ORDER — SOD CITRATE-CITRIC ACID 500-334 MG/5ML PO SOLN: 30.0000 mL | ORAL | Status: DC | PRN

## 2021-09-16 MED ORDER — TRANEXAMIC ACID-NACL 1000-0.7 MG/100ML-% IV SOLN
INTRAVENOUS | Status: AC
  Filled 2021-09-16: qty 100

## 2021-09-16 MED ORDER — OXYTOCIN-SODIUM CHLORIDE 30-0.9 UT/500ML-% IV SOLN
1.0000 m[IU]/min | INTRAVENOUS | Status: DC
  Administered 2021-09-16: 2 m[IU]/min via INTRAVENOUS
  Filled 2021-09-16: qty 500

## 2021-09-16 MED ORDER — MISOPROSTOL 50MCG HALF TABLET
50.0000 ug | ORAL_TABLET | ORAL | Status: DC | PRN
  Administered 2021-09-16 (×2): 50 ug via BUCCAL
  Filled 2021-09-16 (×2): qty 1

## 2021-09-16 MED ORDER — LABETALOL HCL 200 MG PO TABS
200.0000 mg | ORAL_TABLET | Freq: Two times a day (BID) | ORAL | Status: DC
  Administered 2021-09-16: 200 mg via ORAL
  Filled 2021-09-16: qty 1

## 2021-09-16 MED ORDER — OXYCODONE-ACETAMINOPHEN 5-325 MG PO TABS: 1.0000 | ORAL_TABLET | ORAL | Status: DC | PRN

## 2021-09-16 MED ORDER — TRANEXAMIC ACID-NACL 1000-0.7 MG/100ML-% IV SOLN
1000.0000 mg | Freq: Once | INTRAVENOUS | Status: AC
  Administered 2021-09-16: 1000 mg via INTRAVENOUS

## 2021-09-16 NOTE — H&P (Addendum)
LABOR AND DELIVERY ADMISSION HISTORY AND PHYSICAL NOTE ? ?Kimberly Farley is a 31 y.o. female G2P1001 with IUP at [redacted]w[redacted]d by LMP presenting for IOL due to chronic hypertension on Labetalol.  ? ?She reports positive fetal movement. She denies leakage of fluid or vaginal bleeding. She denies headache, vision changes, RUQ pain, or LE swelling.  ? ?Patient received her prenatal care at [facility].  ? ?Sono: ?@[redacted]w[redacted]d , CWD, normal anatomy, cephalic presentation, anterior placental lie, 3066 g, EFW 60% ? ?Prenatal History/Complications: ?- Chronic hypertension: Labetalol 200 mg BID  ?- Pre-diabetes with 2 hour GTT WNL  ?- Elevated BMI (42)  ?- ASCUS on pap smear, next due in 03/2022  ? ?Past Medical History: ?Past Medical History:  ?Diagnosis Date  ? Hypertension   ? ? ?Past Surgical History: ?Past Surgical History:  ?Procedure Laterality Date  ? APPENDECTOMY    ? LAPAROSCOPIC APPENDECTOMY Right 12/31/2015  ? Procedure: APPENDECTOMY LAPAROSCOPIC;  Surgeon: Alphonsa Overall, MD;  Location: [facility];  Service: General;  Laterality: Right;  ? ? ?Obstetrical History: ?OB History   ? ? Gravida  ?2  ? Para  ?1  ? Term  ?1  ? Preterm  ?   ? AB  ?   ? Living  ?1  ?  ? ? SAB  ?   ? IAB  ?   ? Ectopic  ?   ? Multiple  ?0  ? Live Births  ?1  ?   ?  ?  ? ? ?Social History: ?Social History  ? ?Socioeconomic History  ? Marital status: Married  ?  Spouse name: Not on file  ? Number of children: Not on file  ? Years of education: Not on file  ? Highest education level: Not on file  ?Occupational History  ? Not on file  ?Tobacco Use  ? Smoking status: Never  ? Smokeless tobacco: Never  ?Vaping Use  ? Vaping Use: Never used  ?Substance and Sexual Activity  ? Alcohol use: Yes  ?  Comment: socially until pregnancy  ? Drug use: No  ? Sexual activity: Not Currently  ?  Birth control/protection: None  ?Other Topics Concern  ? Not on file  ?Social History Narrative  ? Not on file  ? ?Social Determinants of Health  ? ?Financial Resource Strain: Not on file   ?Food Insecurity: No Food Insecurity  ? Worried About Charity fundraiser in the Last Year: Never true  ? Ran Out of Food in the Last Year: Never true  ?Transportation Needs: No Transportation Needs  ? Lack of Transportation (Medical): No  ? Lack of Transportation (Non-Medical): No  ?Physical Activity: Not on file  ?Stress: Not on file  ?Social Connections: Not on file  ? ? ?Family History: ?Family History  ?Problem Relation Age of Onset  ? Hypertension Mother   ? Hypertension Maternal Grandmother   ? Stroke Maternal Grandmother   ? Hearing loss Maternal Grandmother   ? Alcohol abuse Maternal Grandfather   ? ? ?Allergies: ?Allergies  ?Allergen Reactions  ? Penicillins Nausea Only  ?  Has patient had a PCN reaction causing immediate rash, facial/tongue/throat swelling, SOB or lightheadedness with hypotension: No ?Has patient had a PCN reaction causing severe rash involving mucus membranes or skin necrosis: No ?Has patient had a PCN reaction that required hospitalization No ?Has patient had a PCN reaction occurring within the last 10 years: Yes ?If all of the above answers are "NO", then may proceed with Cephalosporin use. ?  ? ? ?  Medications Prior to Admission  ?Medication Sig Dispense Refill Last Dose  ? aspirin EC 81 MG tablet Take 1 tablet (81 mg total) by mouth daily. Take after 12 weeks for prevention of preeclampsia later in pregnancy 300 tablet 2 09/15/2021 at 1230  ? labetalol (NORMODYNE) 200 MG tablet Take 1 tablet (200 mg total) by mouth 2 (two) times daily. 60 tablet 3 09/15/2021 at 2300  ? prenatal vitamin w/FE, FA (PRENATAL 1 + 1) 27-1 MG TABS tablet Take 1 tablet by mouth daily at 12 noon. 30 tablet 11 09/15/2021 at 1230  ? ? ? ?Review of Systems  ?All systems reviewed and negative except as stated in HPI ? ?Physical Exam ?Blood pressure 124/79, pulse 93, height 5' (1.524 m), weight 97.9 kg, last menstrual period 12/17/2020, unknown if currently breastfeeding. ? ?General appearance: alert, oriented,  NAD ?Lungs: normal respiratory effort on room air  ?Heart: normal rate, warm and well perfused  ?Abdomen: soft, non-tender; gravid  ?Extremities: no LE edema or calf tenderness to palpation  ? ?Presentation: Cephalic per RN ?Fetal monitoring: Baseline 135 bpm, moderate variability, + accels, no decels  ?Uterine activity: Irregular contractions  ?Dilation: Closed ?Effacement (%): Thick ?Station: -3 ?Exam by:: Davis,RN ? ?Prenatal labs: ?ABO, Rh: --/--/O POS (05/10 [id number]) ?Antibody: NEG (05/10 [id number]) ?Rubella: 4.53 (11/11 1051) ?RPR: Non Reactive (02/02 0839)  ?HBsAg: Negative (11/11 1051)  ?HIV: Non Reactive (02/02 0839)  ?GC/Chlamydia: Negative 4/26  ?GBS: Negative/-- (04/26 1145)  ?2-hr GTT: WNL  ?Genetic screening: LR NIPS ?Anatomy US: Normal female  ? ?Prenatal Transfer Tool  ?Maternal Diabetes: No ?Genetic Screening: Normal ?Maternal Ultrasounds/Referrals: Normal ?Fetal Ultrasounds or other Referrals:  None ?Maternal Substance Abuse:  No ?Significant Maternal Medications:  None ?Significant Maternal Lab Results: Group B Strep negative ? ?Results for orders placed or performed during the hospital encounter of 09/16/21 (from the past 24 hour(s))  ?Type and screen  ? Collection Time: 09/16/21  6:52 AM  ?Result Value Ref Range  ? ABO/RH(D) O POS   ? Antibody Screen NEG   ? Sample Expiration    ?  09/19/2021,2359 ?Performed at [facility], [place] [address], [place], [place] [address] ?  ?CBC  ? Collection Time: 09/16/21  6:53 AM  ?Result Value Ref Range  ? WBC 11.5 (H) 4.0 - 10.5 K/uL  ? RBC 4.28 3.87 - 5.11 MIL/uL  ? Hemoglobin 13.6 12.0 - 15.0 g/dL  ? HCT 39.3 36.0 - 46.0 %  ? MCV 91.8 80.0 - 100.0 fL  ? MCH 31.8 26.0 - 34.0 pg  ? MCHC 34.6 30.0 - 36.0 g/dL  ? RDW 13.9 11.5 - 15.5 %  ? Platelets 309 150 - 400 K/uL  ? nRBC 0.0 0.0 - 0.2 %  ?Comprehensive metabolic panel  ? Collection Time: 09/16/21  6:53 AM  ?Result Value Ref Range  ? Sodium 135 135 - 145 mmol/L  ? Potassium 3.7 3.5 - 5.1 mmol/L  ? Chloride 109  98 - 111 mmol/L  ? CO2 18 (L) 22 - 32 mmol/L  ? Glucose, Bld 90 70 - 99 mg/dL  ? BUN 7 6 - 20 mg/dL  ? Creatinine, Ser 0.60 0.44 - 1.00 mg/dL  ? Calcium 9.4 8.9 - 10.3 mg/dL  ? Total Protein 6.6 6.5 - 8.1 g/dL  ? Albumin 2.8 (L) 3.5 - 5.0 g/dL  ? AST 25 15 - 41 U/L  ? ALT 26 0 - 44 U/L  ? Alkaline Phosphatase 113 38 - 126 U/L  ?  Total Bilirubin 0.6 0.3 - 1.2 mg/dL  ? GFR, Estimated >60 >60 mL/min  ? Anion gap 8 5 - 15  ? ? ?Patient Active Problem List  ? Diagnosis Date Noted  ? Chronic hypertension in obstetric context in third trimester 09/16/2021  ? Unwanted fertility 09/08/2021  ? BMI 38.0-38.9,adult 08/19/2021  ? Constipation in pregnancy in third trimester 08/03/2021  ? Atypical squamous cell changes of undetermined significance (ASCUS) with negative high risk human papilloma virus (HPV) pap smear 03/20/21 03/27/2021  ? Prediabetes 03/23/2021  ? Obesity in pregnancy, antepartum 03/23/2021  ? Chronic hypertension affecting pregnancy 03/20/2021  ? Supervision of high risk pregnancy, antepartum 03/10/2021  ? ? ?Assessment: ?Kimberly Farley is a 31 y.o. G2P1001 at [redacted]w[redacted]d here for IOL due to chronic hypertension.  ? ?#Labor: Patient received buccal Cytotec on admission. Will reassess in 4 hours. Plan to place foley balloon as able on next exam.  ?#Pain: PRN  ?#FWB: Cat I  ?#ID: GBS negative  ?#MOF: Formula  ?#MOC: Desires BTL; may need to be interval based on date consent was signed (4/12). If patient desires postpartum BTL, will need to be on 5/13 or later pending timing of delivery and LOS.  ? ?#Chronic hypertension: BP 131/70 on arrival. No symptoms. Continue home Labetalol BID. Obtain baseline pre-e labs.  ? ?Genia Del, MD ?09/16/2021, 9:02 AM  ? ?Attestation of Attending Supervision of Provider:  Evaluation and management procedures were performed by this provider under my supervision and collaboration. I have reviewed the provider's note and chart, and I agree with the management and plan.  ? ?Laurey Arrow, MD ?Faculty Practice, [place] ? ?

## 2021-09-16 NOTE — Plan of Care (Signed)
?  Problem: Education: ?Goal: Knowledge of General Education information will improve ?Description: Including pain rating scale, medication(s)/side effects and non-pharmacologic comfort measures ?Outcome: Completed/Met ?  ?Problem: Health Behavior/Discharge Planning: ?Goal: Ability to manage health-related needs will improve ?Outcome: Completed/Met ?  ?Problem: Clinical Measurements: ?Goal: Ability to maintain clinical measurements within normal limits will improve ?Outcome: Completed/Met ?Goal: Will remain free from infection ?Outcome: Completed/Met ?Goal: Diagnostic test results will improve ?Outcome: Completed/Met ?Goal: Respiratory complications will improve ?Outcome: Completed/Met ?Goal: Cardiovascular complication will be avoided ?Outcome: Completed/Met ?  ?Problem: Activity: ?Goal: Risk for activity intolerance will decrease ?Outcome: Completed/Met ?  ?Problem: Nutrition: ?Goal: Adequate nutrition will be maintained ?Outcome: Completed/Met ?  ?Problem: Coping: ?Goal: Level of anxiety will decrease ?Outcome: Completed/Met ?  ?Problem: Elimination: ?Goal: Will not experience complications related to bowel motility ?Outcome: Completed/Met ?Goal: Will not experience complications related to urinary retention ?Outcome: Completed/Met ?  ?Problem: Pain Managment: ?Goal: General experience of comfort will improve ?Outcome: Completed/Met ?  ?Problem: Safety: ?Goal: Ability to remain free from injury will improve ?Outcome: Completed/Met ?  ?Problem: Skin Integrity: ?Goal: Risk for impaired skin integrity will decrease ?Outcome: Completed/Met ?  ?Problem: Education: ?Goal: Knowledge of Childbirth will improve ?Outcome: Completed/Met ?Goal: Ability to make informed decisions regarding treatment and plan of care will improve ?Outcome: Completed/Met ?Goal: Ability to state and carry out methods to decrease the pain will improve ?Outcome: Completed/Met ?Goal: Individualized Educational Video(s) ?Outcome: Completed/Met ?   ?Problem: Coping: ?Goal: Ability to verbalize concerns and feelings about labor and delivery will improve ?Outcome: Completed/Met ?  ?Problem: Life Cycle: ?Goal: Ability to make normal progression through stages of labor will improve ?Outcome: Completed/Met ?Goal: Ability to effectively push during vaginal delivery will improve ?Outcome: Completed/Met ?  ?Problem: Role Relationship: ?Goal: Will demonstrate positive interactions with the child ?Outcome: Completed/Met ?  ?Problem: Safety: ?Goal: Risk of complications during labor and delivery will decrease ?Outcome: Completed/Met ?  ?Problem: Pain Management: ?Goal: Relief or control of pain from uterine contractions will improve ?Outcome: Completed/Met ?  ?

## 2021-09-16 NOTE — Progress Notes (Signed)
Labor Progress Note ?[name] is a 31 y.o. G2P1001 at [redacted]w[redacted]d who presented for IOL due to [facility].  ? ?S: Doing well. Feeling her contractions more. No concerns.  ? ?O:  ?BP 137/89   Pulse 89   Temp 98.3 ?F (36.8 ?C) (Oral)   Ht 5' (1.524 m)   Wt 97.9 kg   LMP 12/17/2020 (Exact Date)   BMI 42.15 kg/m?  ? ?EFM: Baseline 135 bpm, moderate variability, + accels, no decels  ?Toco: Irregular contractions, evyer 1-5 minutes  ? ?CVE: Dilation: 4 ?Effacement (%): 50 ?Station: -3 ?Presentation: Vertex ?Exam by:: Kimberly Fare, MD ? ?A&P: 31 y.o. G2P1001 [redacted]w[redacted]d  ? ?#Labor: Progressing well. AROM performed with clear fluid. Mom and baby tolerated this well. Contracting irregularly every 1-5 minutes. Will start Pitocin and reassess in 4 hours, sooner as needed.  ?#Pain: PRN ?#FWB: Cat 1  ?#GBS negative ? ?Worthy Rancher, MD ?5:29 PM ? ?

## 2021-09-16 NOTE — Discharge Summary (Signed)
Postpartum Discharge Summary ?   ?Patient Name: Kimberly Farley ?DOB: 03-10-1991 ?MRN: [id number] ? ?Date of admission: 09/16/2021 ?Delivery date:09/16/2021  ?Delivering provider: Patriciaann Clan  ?Date of discharge: 09/18/2021 ? ?Admitting diagnosis: Chronic hypertension in obstetric context in third trimester [O10.913] ?Intrauterine pregnancy: [redacted]w[redacted]d     ?Secondary diagnosis:  Principal Problem: ?  Chronic hypertension affecting pregnancy ?Active Problems: ?  Supervision of high risk pregnancy, antepartum ?  Prediabetes ?  Obesity in pregnancy, antepartum ?  Atypical squamous cell changes of undetermined significance (ASCUS) with negative high risk human papilloma virus (HPV) pap smear 03/20/21 ?  Unwanted fertility ? ?Additional problems: none    ?Discharge diagnosis: Term Pregnancy Delivered and CHTN                                              ?Post partum procedures:none ?Augmentation: AROM, Pitocin, and Cytotec ?Complications: None ? ?Hospital course: Induction of Labor With Vaginal Delivery   ?31 y.o. yo G2P2002 at [redacted]w[redacted]d was admitted to the hospital 09/16/2021 for induction of labor.  Indication for induction:  CHTN .  Patient had an uncomplicated labor course as follows: ?Membrane Rupture Time/Date: 4:37 PM ,09/16/2021   ?Delivery Method:Vaginal, Spontaneous  ?Episiotomy: None  ?Lacerations:  Periurethral  ?Details of delivery can be found in separate delivery note.  Patient had a routine postpartum course.Started on Procardia and Lasix.  Patient is discharged home 09/18/21. ? ?Newborn Data: ?Birth date:09/16/2021  ?Birth time:8:56 PM  ?Gender:Female  ?Living status:Living  ?Apgars:9 ,9  ?Weight:6 lb 14.8 oz (3.14 kg)  ? ?Magnesium Sulfate received: No ?BMZ received: No ?Rhophylac:No ?MMR:No ?T-DaP:declined ?Flu: No ?Transfusion:No ? ?Physical exam  ?Vitals:  ? 09/17/21 0914 09/17/21 1529 09/17/21 2100 09/18/21 0624  ?BP: 121/64 120/68 111/63 128/82  ?Pulse: 97 97 (!) 101 (!) 107  ?Resp: $Remov'17 17 17 18  'nxtjDI$ ?Temp:  98.5 ?F (36.9 ?C) 97.9 ?F (36.6 ?C) 98.5 ?F (36.9 ?C) 98.3 ?F (36.8 ?C)  ?TempSrc:   Oral Oral  ?SpO2: 97% 98% 100% 100%  ?Weight:      ?Height:      ? ?General: alert, cooperative, and no distress ?Lochia: appropriate ?Uterine Fundus: firm ?Incision: N/A ?DVT Evaluation: No evidence of DVT seen on physical exam. ?Labs: ?Lab Results  ?Component Value Date  ? WBC 17.3 (H) 09/17/2021  ? HGB 11.6 (L) 09/17/2021  ? HCT 32.9 (L) 09/17/2021  ? MCV 93.2 09/17/2021  ? PLT 272 09/17/2021  ? ? ?  Latest Ref Rng & Units 09/16/2021  ?  6:53 AM  ?CMP  ?Glucose 70 - 99 mg/dL 90    ?BUN 6 - 20 mg/dL 7    ?Creatinine 0.44 - 1.00 mg/dL 0.60    ?Sodium 135 - 145 mmol/L 135    ?Potassium 3.5 - 5.1 mmol/L 3.7    ?Chloride 98 - 111 mmol/L 109    ?CO2 22 - 32 mmol/L 18    ?Calcium 8.9 - 10.3 mg/dL 9.4    ?Total Protein 6.5 - 8.1 g/dL 6.6    ?Total Bilirubin 0.3 - 1.2 mg/dL 0.6    ?Alkaline Phos 38 - 126 U/L 113    ?AST 15 - 41 U/L 25    ?ALT 0 - 44 U/L 26    ? ?Edinburgh Score: ? ?  09/17/2021  ?  3:43 PM  ?Flavia Shipper Postnatal Depression Scale Screening Tool  ?  I have been able to laugh and see the funny side of things. 0  ?I have looked forward with enjoyment to things. 0  ?I have blamed myself unnecessarily when things went wrong. 0  ?I have been anxious or worried for no good reason. 0  ?I have felt scared or panicky for no good reason. 0  ?Things have been getting on top of me. 0  ?I have been so unhappy that I have had difficulty sleeping. 0  ?I have felt sad or miserable. 0  ?I have been so unhappy that I have been crying. 0  ?The thought of harming myself has occurred to me. 0  ?Edinburgh Postnatal Depression Scale Total 0  ? ? ? ?After visit meds:  ?Allergies as of 09/18/2021   ? ?   Reactions  ? Penicillins Nausea Only  ? Has patient had a PCN reaction causing immediate rash, facial/tongue/throat swelling, SOB or lightheadedness with hypotension: No ?Has patient had a PCN reaction causing severe rash involving mucus membranes or  skin necrosis: No ?Has patient had a PCN reaction that required hospitalization No ?Has patient had a PCN reaction occurring within the last 10 years: Yes ?If all of the above answers are "NO", then may proceed with Cephalosporin use.  ? ?  ? ?  ?Medication List  ?  ? ?STOP taking these medications   ? ?aspirin EC 81 MG tablet ?  ?labetalol 200 MG tablet ?Commonly known as: NORMODYNE ?  ? ?  ? ?TAKE these medications   ? ?acetaminophen 325 MG tablet ?Commonly known as: Tylenol ?Take 2 tablets (650 mg total) by mouth every 4 (four) hours as needed (for pain scale < 4). ?  ?furosemide 20 MG tablet ?Commonly known as: LASIX ?Take 1 tablet (20 mg total) by mouth daily. ?  ?ibuprofen 600 MG tablet ?Commonly known as: ADVIL ?Take 1 tablet (600 mg total) by mouth every 6 (six) hours. ?  ?NIFEdipine 30 MG 24 hr tablet ?Commonly known as: ADALAT CC ?Take 1 tablet (30 mg total) by mouth daily. ?  ?prenatal vitamin w/FE, FA 27-1 MG Tabs tablet ?Take 1 tablet by mouth daily at 12 noon. ?  ? ?  ? ?Discharge home in stable condition ?Infant Feeding: Breast ?Infant Disposition:home with mother ?Discharge instruction: per After Visit Summary and Postpartum booklet. ?Activity: Advance as tolerated. Pelvic rest for 6 weeks.  ?Diet: routine diet ?Future Appointments: ?Future Appointments  ?Date Time Provider [employer]  ?09/28/2021  9:55 AM Griffin Basil, MD [facility]  ?10/27/2021  8:55 AM Clarnce Flock, MD [facility]  ? ?Follow up Visit: Message sent to [facility] by Dr Higinio Plan: (not yet)  ?Please schedule this patient for a In person postpartum visit in 6 weeks with the following provider: Any provider. ?Additional Postpartum F/U:Incision check 1 week, BP check 1 week, and 1 week appt with MD to discuss interval BTL (if unable to have all 3 together, can meet with MD in 1-2 weeks)    ?High risk pregnancy complicated by: HTN ?Delivery mode:  Vaginal, Spontaneous  ?Anticipated Birth Control:  Plans Interval  BTL ? ?09/18/2021 ?Gabriel Carina, CNM ? ? ? ?

## 2021-09-16 NOTE — Progress Notes (Signed)
Labor Progress Note ?Kimberly Farley is a 31 y.o. G2P1001 at [redacted]w[redacted]d who presented for IOL due to [facility]. ? ?S: Doing well. No concerns.  ? ?O:  ?BP 125/79   Pulse (!) 101   Temp 97.9 ?F (36.6 ?C) (Oral)   Ht 5' (1.524 m)   Wt 97.9 kg   LMP 12/17/2020 (Exact Date)   BMI 42.15 kg/m?  ? ?EFM: Baseline 135 bpm, moderate variability, + accels, no decels  ?Toco: Every 2-3 minutes  ? ?CVE: Dilation: 2 ?Effacement (%): 30 ?Station: -3 ?Presentation: Vertex ?Exam by:: Dyke Brackett, RN ? ?A&P: 31 y.o. G2P1001 [redacted]w[redacted]d  ? ?#Labor: Progressing well. Additional dose of Cytotec given. Will reassess in 4 hours. Plan for AROM on next exam.  ?#Pain: PRN ?#FWB: Cat 1  ?#GBS negative ? ?#cHTN: BP within normal limits. CBC, CMP and P:C ratio within normal limits on admission. Will continue to monitor.  ? ?Worthy Rancher, MD ?1:53 PM; ?

## 2021-09-17 ENCOUNTER — Encounter (HOSPITAL_COMMUNITY): Payer: Self-pay | Admitting: Obstetrics and Gynecology

## 2021-09-17 LAB — CBC
HCT: 32.9 % — ABNORMAL LOW (ref 36.0–46.0)
Hemoglobin: 11.6 g/dL — ABNORMAL LOW (ref 12.0–15.0)
MCH: 32.9 pg (ref 26.0–34.0)
MCHC: 35.3 g/dL (ref 30.0–36.0)
MCV: 93.2 fL (ref 80.0–100.0)
Platelets: 272 10*3/uL (ref 150–400)
RBC: 3.53 MIL/uL — ABNORMAL LOW (ref 3.87–5.11)
RDW: 13.9 % (ref 11.5–15.5)
WBC: 17.3 10*3/uL — ABNORMAL HIGH (ref 4.0–10.5)
nRBC: 0 % (ref 0.0–0.2)

## 2021-09-17 MED ORDER — MEASLES, MUMPS & RUBELLA VAC IJ SOLR: 0.5000 mL | Freq: Once | INTRAMUSCULAR | Status: DC

## 2021-09-17 MED ORDER — SODIUM CHLORIDE 0.9 % IV SOLN: 250.0000 mL | INTRAVENOUS | Status: DC | PRN

## 2021-09-17 MED ORDER — FUROSEMIDE 20 MG PO TABS
20.0000 mg | ORAL_TABLET | Freq: Every day | ORAL | Status: DC
  Administered 2021-09-17 – 2021-09-18 (×2): 20 mg via ORAL
  Filled 2021-09-17 (×2): qty 1

## 2021-09-17 MED ORDER — TETANUS-DIPHTH-ACELL PERTUSSIS 5-2.5-18.5 LF-MCG/0.5 IM SUSY: 0.5000 mL | PREFILLED_SYRINGE | Freq: Once | INTRAMUSCULAR | Status: DC

## 2021-09-17 MED ORDER — SODIUM CHLORIDE 0.9% FLUSH: 3.0000 mL | Freq: Two times a day (BID) | INTRAVENOUS | Status: DC

## 2021-09-17 MED ORDER — IBUPROFEN 600 MG PO TABS
600.0000 mg | ORAL_TABLET | Freq: Four times a day (QID) | ORAL | Status: DC
  Administered 2021-09-17 – 2021-09-18 (×6): 600 mg via ORAL
  Filled 2021-09-17 (×6): qty 1

## 2021-09-17 MED ORDER — SODIUM CHLORIDE 0.9% FLUSH: 3.0000 mL | INTRAVENOUS | Status: DC | PRN

## 2021-09-17 MED ORDER — DIPHENHYDRAMINE HCL 25 MG PO CAPS: 25.0000 mg | ORAL_CAPSULE | Freq: Four times a day (QID) | ORAL | Status: DC | PRN

## 2021-09-17 MED ORDER — SENNOSIDES-DOCUSATE SODIUM 8.6-50 MG PO TABS
2.0000 | ORAL_TABLET | ORAL | Status: DC
  Administered 2021-09-17: 2 via ORAL
  Filled 2021-09-17 (×2): qty 2

## 2021-09-17 MED ORDER — COCONUT OIL OIL: 1.0000 "application " | TOPICAL_OIL | Status: DC | PRN

## 2021-09-17 MED ORDER — WITCH HAZEL-GLYCERIN EX PADS: 1.0000 "application " | MEDICATED_PAD | CUTANEOUS | Status: DC | PRN

## 2021-09-17 MED ORDER — BENZOCAINE-MENTHOL 20-0.5 % EX AERO: 1.0000 "application " | INHALATION_SPRAY | CUTANEOUS | Status: DC | PRN

## 2021-09-17 MED ORDER — ONDANSETRON HCL 4 MG PO TABS: 4.0000 mg | ORAL_TABLET | ORAL | Status: DC | PRN

## 2021-09-17 MED ORDER — ONDANSETRON HCL 4 MG/2ML IJ SOLN: 4.0000 mg | INTRAMUSCULAR | Status: DC | PRN

## 2021-09-17 MED ORDER — ZOLPIDEM TARTRATE 5 MG PO TABS: 5.0000 mg | ORAL_TABLET | Freq: Every evening | ORAL | Status: DC | PRN

## 2021-09-17 MED ORDER — ACETAMINOPHEN 325 MG PO TABS
650.0000 mg | ORAL_TABLET | ORAL | Status: DC | PRN
  Administered 2021-09-18: 650 mg via ORAL
  Filled 2021-09-17: qty 2

## 2021-09-17 MED ORDER — NIFEDIPINE ER OSMOTIC RELEASE 30 MG PO TB24
30.0000 mg | ORAL_TABLET | Freq: Every day | ORAL | Status: DC
  Administered 2021-09-17 – 2021-09-18 (×2): 30 mg via ORAL
  Filled 2021-09-17 (×2): qty 1

## 2021-09-17 MED ORDER — DIBUCAINE (PERIANAL) 1 % EX OINT: 1.0000 "application " | TOPICAL_OINTMENT | CUTANEOUS | Status: DC | PRN

## 2021-09-17 MED ORDER — SIMETHICONE 80 MG PO CHEW: 80.0000 mg | CHEWABLE_TABLET | ORAL | Status: DC | PRN

## 2021-09-17 MED ORDER — PRENATAL MULTIVITAMIN CH
1.0000 | ORAL_TABLET | Freq: Every day | ORAL | Status: DC
  Administered 2021-09-17 – 2021-09-18 (×2): 1 via ORAL
  Filled 2021-09-17 (×2): qty 1

## 2021-09-17 NOTE — Lactation Note (Signed)
This note was copied from a baby's chart. ?Lactation Consultation Note ? ?Patient Name: Girl Martrice Jablonowski ?Today's Date: 09/17/2021 ?  ?Age:29 hours ?Per RN on MBU , Mom declined Switzer in L&D and  on MBU. ?Maternal Data ?  ? ?Feeding ?  ? ?LATCH Score ?  ? ?  ? ?  ? ?  ? ?  ? ?  ? ? ?Lactation Tools Discussed/Used ?  ? ?Interventions ?  ? ?Discharge ?  ? ?Consult Status ?  ? ? ? ?Vicente Serene ?09/17/2021, 12:12 AM ? ? ? ?

## 2021-09-17 NOTE — Progress Notes (Signed)
POSTPARTUM PROGRESS NOTE ? ?Post Partum Day 1 ? ?Subjective: ? ?Kimberly Farley is a 31 y.o. [id number] s/p VD at [redacted]w[redacted]d.  She reports she is doing well. No acute events overnight. She denies any problems with ambulating, voiding or po intake. Denies nausea or vomiting.  Pain is well controlled.  Lochia is mild. ? ?Objective: ?Blood pressure 117/74, pulse 97, temperature 98.9 ?F (37.2 ?C), temperature source Oral, resp. rate 16, height 5' (1.524 m), weight 97.9 kg, last menstrual period 12/17/2020, SpO2 99 %, unknown if currently breastfeeding. ? ?Physical Exam:  ?General: alert, cooperative and no distress ?Chest: no respiratory distress ?Heart:regular rate, distal pulses intact ?Uterine Fundus: firm, appropriately tender ?DVT Evaluation: No calf swelling or tenderness ?Extremities: 1+ edema to mid shin  ?Skin: warm, dry ? ?Recent Labs  ?  09/16/21 ?0653 09/17/21 ?6599  ?HGB 13.6 11.6*  ?HCT 39.3 32.9*  ? ? ?Assessment/Plan: ?Kimberly Farley is a 31 y.o. [id number] s/p VD at [redacted]w[redacted]d  ? ?PPD#1 - Doing well ? Routine postpartum care ? ?Contraception: interval BTL  ?Feeding: formula  ? ?#Chronic HTN: Start procardia and lasix today.  ?  ? ?Dispo: Plan for discharge tomorrow (late delivery overnight). ? ? LOS: 1 day  ? ?Leticia Penna, DO  ?OB Fellow  ?09/17/2021, 9:09 AM  ?

## 2021-09-18 ENCOUNTER — Other Ambulatory Visit (HOSPITAL_COMMUNITY): Payer: Self-pay

## 2021-09-18 ENCOUNTER — Encounter (HOSPITAL_COMMUNITY): Payer: Self-pay | Admitting: Obstetrics and Gynecology

## 2021-09-18 MED ORDER — FUROSEMIDE 20 MG PO TABS
20.0000 mg | ORAL_TABLET | Freq: Every day | ORAL | 0 refills | Status: DC
Start: 2021-09-18 — End: 2021-10-27
  Filled 2021-09-18: qty 4, 4d supply, fill #0

## 2021-09-18 MED ORDER — ACETAMINOPHEN 325 MG PO TABS: 650.0000 mg | ORAL_TABLET | ORAL | Status: DC | PRN

## 2021-09-18 MED ORDER — IBUPROFEN 600 MG PO TABS
600.0000 mg | ORAL_TABLET | Freq: Four times a day (QID) | ORAL | 0 refills | Status: DC
  Filled 2021-09-18: qty 30, 8d supply, fill #0

## 2021-09-18 MED ORDER — NIFEDIPINE ER 30 MG PO TB24
30.0000 mg | ORAL_TABLET | Freq: Every day | ORAL | 0 refills | Status: DC
  Filled 2021-09-18: qty 30, 30d supply, fill #0

## 2021-09-24 ENCOUNTER — Telehealth (HOSPITAL_COMMUNITY): Payer: Self-pay | Admitting: *Deleted

## 2021-09-24 NOTE — Telephone Encounter (Signed)
Hospital Discharge Follow-Up Call:  Patient reports that she is well and has no concerns about her healing process.  EPDS today was 0 and she endorses this accurately reflects that she is doing well emotionally. She declines information about hospital postpartum groups.  Patient reports that baby is well and she has no concerns about baby's health.  She reports that baby sleeps in a bassinet.  Reviewed ABCs of Safe Sleep.

## 2021-09-24 NOTE — Telephone Encounter (Signed)
Attempted Hospital Discharge Follow-Up Call.  Left voice mail requesting that patient return RN's phone call.  

## 2021-09-28 ENCOUNTER — Ambulatory Visit (INDEPENDENT_AMBULATORY_CARE_PROVIDER_SITE_OTHER): Payer: Medicaid Other | Admitting: Obstetrics and Gynecology

## 2021-09-28 VITALS — BP 119/83 | HR 91 | Wt 197.3 lb

## 2021-09-28 DIAGNOSIS — Z0130 Encounter for examination of blood pressure without abnormal findings: Secondary | ICD-10-CM

## 2021-09-28 NOTE — Progress Notes (Signed)
Blood Pressure Check Visit  Kimberly Farley is here for blood pressure check following induced vaginal delivery on 09/16/21. BP today is 119/83. Patient denies any dizziness, blurred vision, headache, shortness of breath, peripheral edema. Patient takes 30 mg of Nifedipine daily. I recommended patient continue taking the Nifedipine and follow up at her postpartum appointment. I reviewed patient's postpartum appointment date and time with her. I reviewed signs and symptoms of pre-eclampsia with patient. Patient verbalized understanding and denies any other questions.   Patient states she does not think she wants to have an interval BTL anymore and does not need to discuss BTL with the provider at this time. I encouraged patient to notify us if this changes. Patient verbalized understanding and states she will follow up with [place] at her postpartum appointment.   Cline Crock, RN 09/28/2021    Patient was assessed and managed by nursing staff during this encounter. I have reviewed the chart and agree with the documentation and plan. I have also made any necessary editorial changes.  History as above, pt declines tubal at this time and BP currently well controlled with oral procardia.  Warden Fillers, MD 09/28/2021 5:34 PM

## 2021-10-26 NOTE — Progress Notes (Unsigned)
Post Partum Visit Note  Kimberly Farley is a 31 y.o. G31P2002 female who presents for a postpartum visit. She is 5 weeks postpartum following a normal spontaneous vaginal delivery.  I have fully reviewed the prenatal and intrapartum course. The delivery was at [redacted]w[redacted]d gestational weeks.  Anesthesia:  lidocaine and IV fent for repair after delivery  . Postpartum course has been uneventful. Baby is doing well. Baby is feeding by  pumping breast milk bottle fed . Bleeding no bleeding. Bowel function is normal. Bladder function is normal. Patient is not sexually active. Contraception method is none. Husband having vasectomy.  Postpartum depression screening: negative.   The pregnancy intention screening data noted above was reviewed. Potential methods of contraception were discussed. The patient elected to proceed with No data recorded.   Edinburgh Postnatal Depression Scale - 10/27/21 0901       Edinburgh Postnatal Depression Scale:  In the Past 7 Days   I have been able to laugh and see the funny side of things. 0    I have looked forward with enjoyment to things. 0    I have blamed myself unnecessarily when things went wrong. 0    I have been anxious or worried for no good reason. 1    I have felt scared or panicky for no good reason. 0    Things have been getting on top of me. 0    I have been so unhappy that I have had difficulty sleeping. 0    I have felt sad or miserable. 0    I have been so unhappy that I have been crying. 0    The thought of harming myself has occurred to me. 0    Edinburgh Postnatal Depression Scale Total 1             Health Maintenance Due  Topic Date Due   COVID-19 Vaccine (3 - Pfizer series) 12/10/2019    The following portions of the patient's history were reviewed and updated as appropriate: allergies, current medications, past family history, past medical history, past social history, past surgical history, and problem list.  Review of  Systems Pertinent items noted in HPI and remainder of comprehensive ROS otherwise negative.  Objective:  BP 124/89   Pulse 90   Wt 194 lb (88 kg)   Breastfeeding Yes   BMI 37.89 kg/m    General:  alert, cooperative, and appears stated age   Breasts:  not indicated  Lungs: Comfortalbe on room air  Wound N/a  GU exam:  not indicated        Assessment:    There are no diagnoses linked to this encounter.  Normal postpartum exam.   Plan:   Essential components of care per ACOG recommendations:  1.  Mood and well being: Patient with negative depression screening today. Reviewed local resources for support.  - Patient tobacco use? No.   - hx of drug use? No.    2. Infant care and feeding:  -Patient currently breastmilk feeding? Yes. Reviewed importance of draining breast regularly to support lactation.  -Social determinants of health (SDOH) reviewed in EPIC. No concerns  3. Sexuality, contraception and birth spacing - Patient does not want a pregnancy in the next year.  Desired family size is 2 children.  - Reviewed reproductive life planning. Reviewed contraceptive methods based on pt preferences and effectiveness.  Patient desired Vasectomy today.   - Discussed birth spacing of 18 months  4. Sleep and fatigue -Encouraged  family/partner/community support of 4 hrs of uninterrupted sleep to help with mood and fatigue  5. Physical Recovery  - Discussed patients delivery and complications. She describes her labor as mixed. - Patient had a Vaginal, no problems at delivery. Patient had a  periurethral  laceration. Perineal healing reviewed. Patient expressed understanding - Patient has urinary incontinence? No. - Patient is safe to resume physical and sexual activity  6.  Health Maintenance - HM due items addressed Yes - Last pap smear  Diagnosis  Date Value Ref Range Status  03/20/2021 (A)  Final   - Atypical squamous cells of undetermined significance (ASC-US)   Pap  smear not done at today's visit. Needs repeat 03/2022. -Breast Cancer screening indicated? No.   7. Chronic Disease/Pregnancy Condition follow up: Hypertension - Normotensive but borderline on Nifedipine 30 XL, recommend continuing - PCP follow up  Venora Maples, MD Center for [facility], [facility]

## 2021-10-27 ENCOUNTER — Encounter: Payer: Self-pay | Admitting: Family Medicine

## 2021-10-27 ENCOUNTER — Ambulatory Visit (INDEPENDENT_AMBULATORY_CARE_PROVIDER_SITE_OTHER): Payer: Medicaid Other | Admitting: Family Medicine

## 2021-10-27 DIAGNOSIS — O10919 Unspecified pre-existing hypertension complicating pregnancy, unspecified trimester: Secondary | ICD-10-CM | POA: Diagnosis not present

## 2021-10-27 DIAGNOSIS — Z3009 Encounter for other general counseling and advice on contraception: Secondary | ICD-10-CM

## 2021-10-27 DIAGNOSIS — R87610 Atypical squamous cells of undetermined significance on cytologic smear of cervix (ASC-US): Secondary | ICD-10-CM | POA: Diagnosis not present

## 2022-03-31 ENCOUNTER — Ambulatory Visit: Payer: Medicaid Other | Admitting: Family Medicine

## 2022-04-15 IMAGING — US US MFM FETAL BPP W/O NON-STRESS
1 series · 14 of 28 positions shown · non-contrast
Comparison: none

[Series 1: us mfm fetal bpp w/o non-stress · 33 acquisitions, 14 frames shown]
[im 2/33]
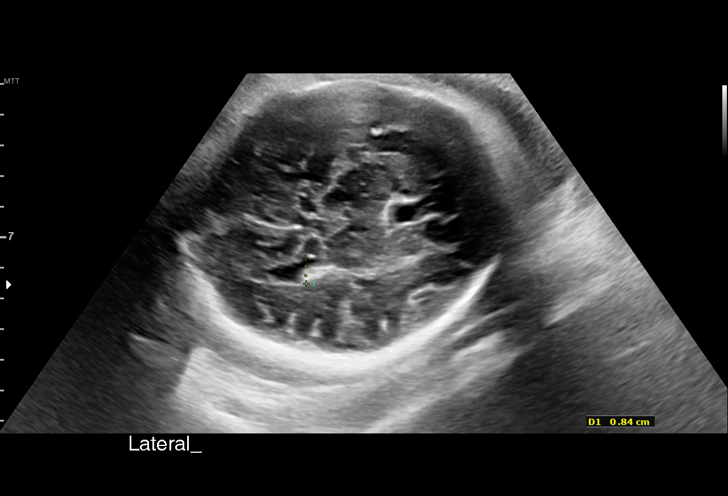
[im 4/33]
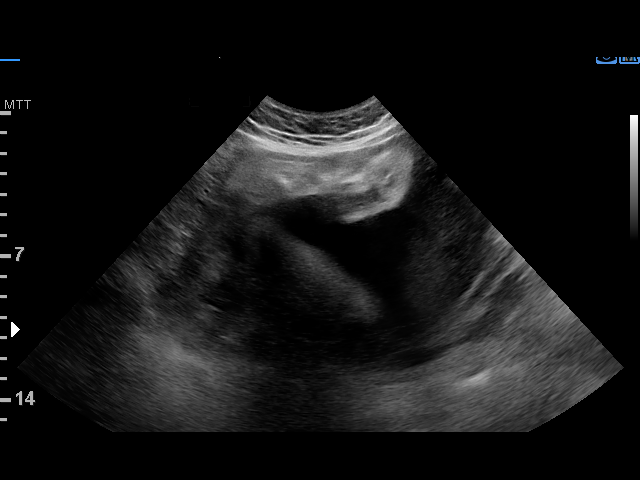
[im 6/33]
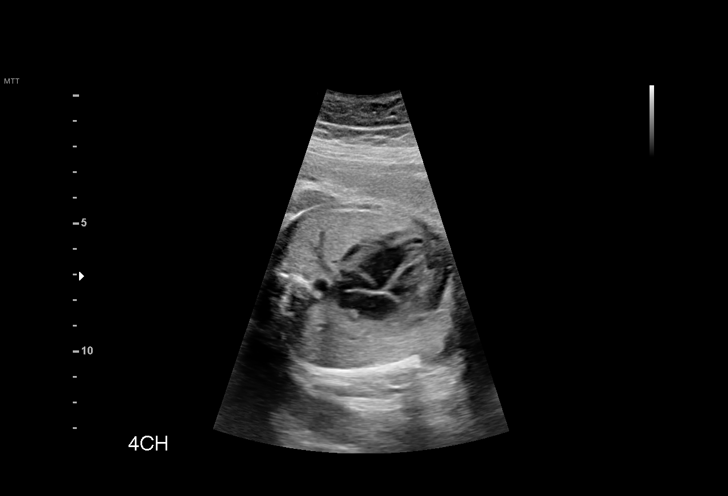
[im 9/33]
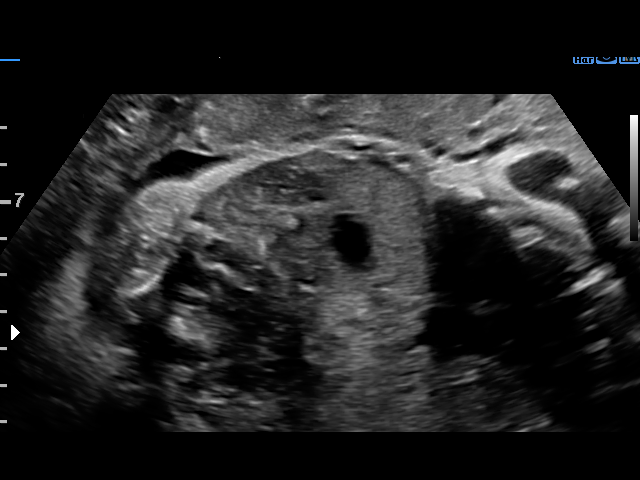
[im 11/33]
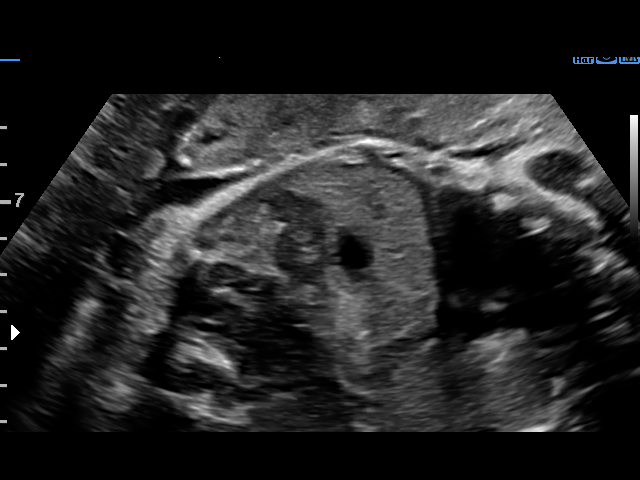
[im 14/33]
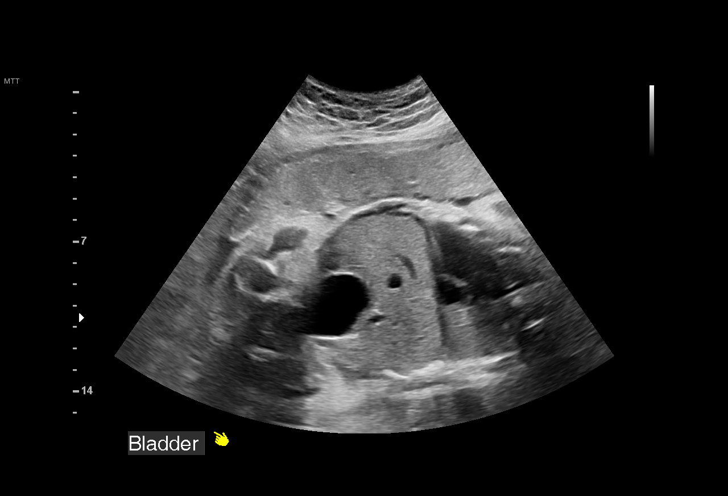
[im 16/33]
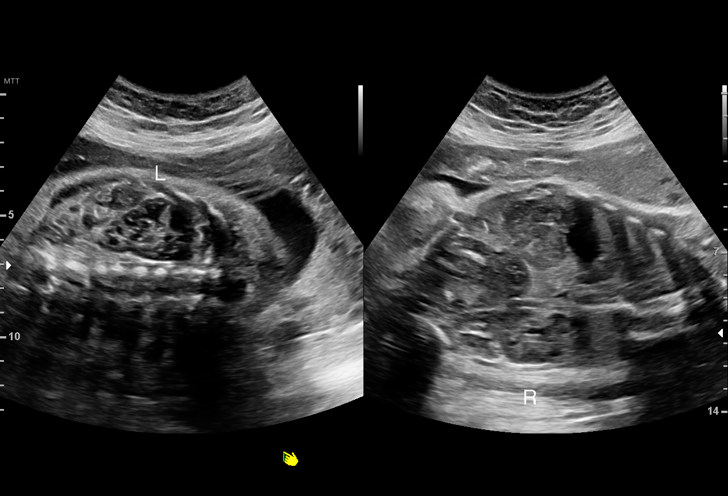
[im 18/33]
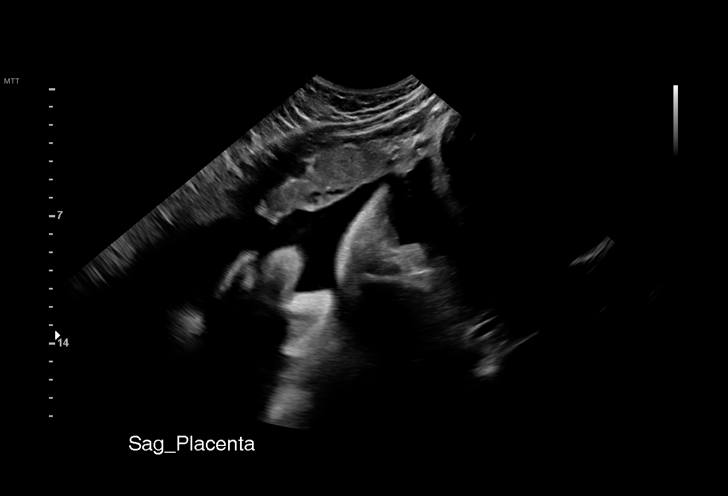
[im 21/33]
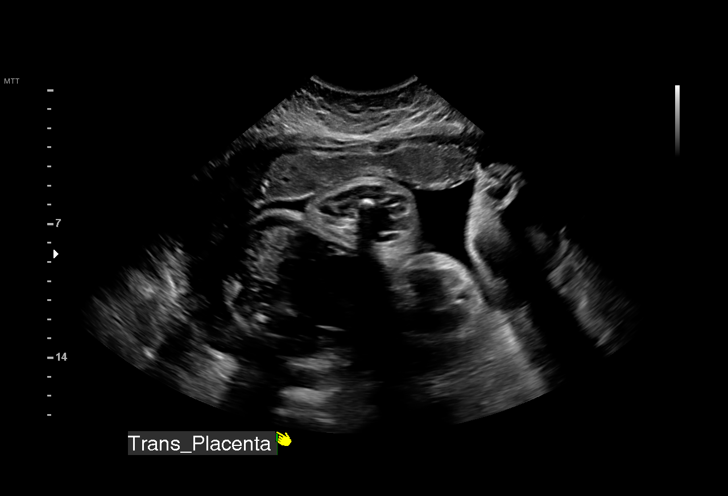
[im 23/33]
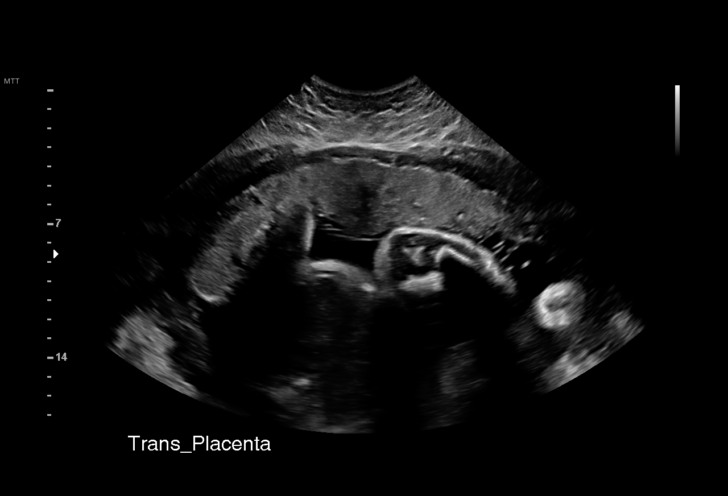
[im 25/33]
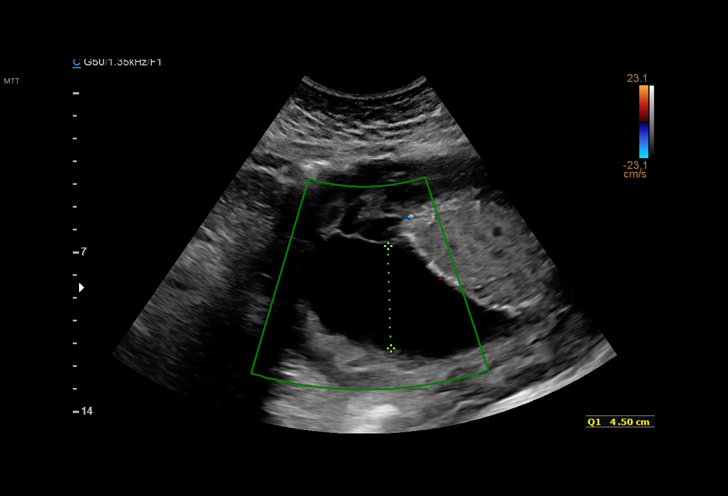
[im 28/33]
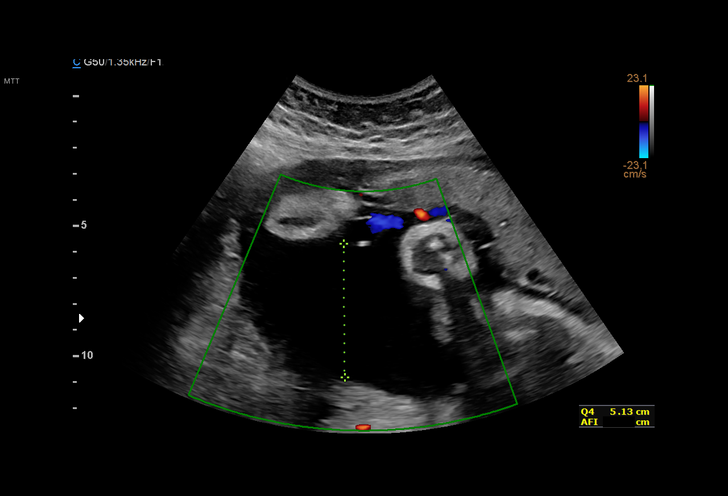
[im 30/33]
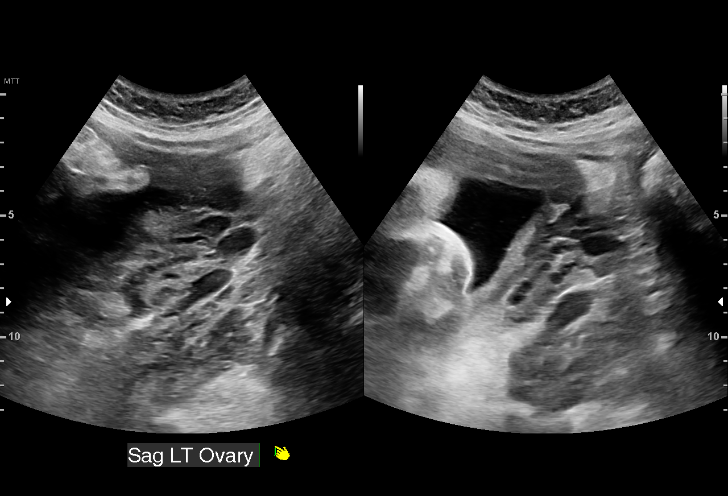
[im 33/33]
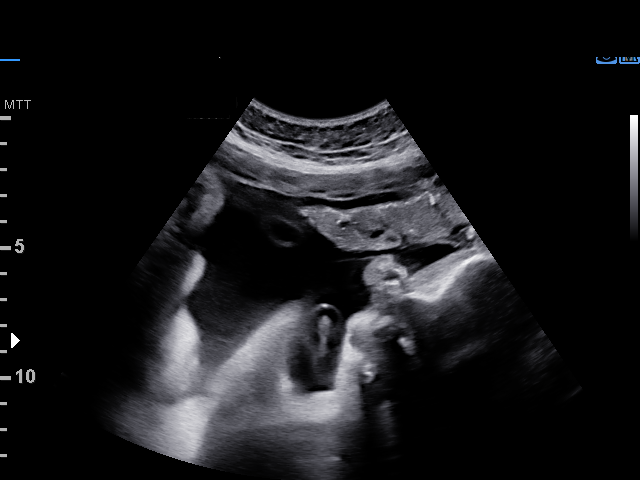

[14 of 28 positions shown; findings below may reference images not displayed]

Indications

 34 weeks gestation of pregnancy
 Hypertension - Chronic/Pre-existing
 (labetalol)
 Obesity complicating pregnancy, third
 trimester (pregravid BMI 34)
 Encounter for other antenatal screening
 follow-up
Fetal Evaluation

 Num Of Fetuses:         1
 Fetal Heart Rate(bpm):  148
 Cardiac Activity:       Observed
 Presentation:           Cephalic
 Placenta:               Anterior
 P. Cord Insertion:      Previously Visualized

 Amniotic Fluid
 AFI FV:      Within normal limits

 AFI Sum(cm)     %Tile       Largest Pocket(cm)
 14.5            52

 RUQ(cm)       RLQ(cm)       LUQ(cm)        LLQ(cm)
 4.5           5.1           3
Biophysical Evaluation
 Amniotic F.V:   Pocket => 2 cm             F. Tone:        Observed
 F. Movement:    Observed                   Score:          [DATE]
 F. Breathing:   Observed
OB History

 Gravidity:    2         Term:   1
 Living:       1
Gestational Age

 LMP:           34w 5d        Date:  12/17/20                  EDD:   09/23/21
 Best:          34w 5d     Det. By:  LMP  (12/17/20)          EDD:   09/23/21
Anatomy

 Cranium:               Appears normal         LVOT:                   Previously seen
 Cavum:                 Appears normal         Aortic Arch:            Previously seen
 Ventricles:            Appears normal         Ductal Arch:            Previously seen
 Choroid Plexus:        Previously seen        Diaphragm:              Appears normal
 Cerebellum:            Previously seen        Stomach:                Appears normal, left
                                                                       sided
 Posterior Fossa:       Previously seen        Abdomen:                Previously seen
 Nuchal Fold:           Previously seen        Abdominal Wall:         Previously seen
 Face:                  Orbits and profile     Cord Vessels:           Previously seen
                        previously seen
 Lips:                  Previously seen        Kidneys:                Appear normal
 Palate:                Previously seen        Bladder:                Appears normal
 Thoracic:              Appears normal         Spine:                  Previously seen
 Heart:                 Appears normal         Upper Extremities:      Previously seen
                        (4CH, axis, and
                        situs)
 RVOT:                  Previously seen        Lower Extremities:      Previously seen

 Other:  Female gender previously seen. Nasal bone, Heels, VC and 3VTV
         previously seen.
Cervix Uterus Adnexa

 Cervix
 Normal appearance by transabdominal scan.

 Uterus
 No abnormality visualized.

 Right Ovary
 Within normal limits.

 Left Ovary
 Within normal limits.

 Cul De Sac
 No free fluid seen.

 Adnexa
 No abnormality visualized.
Impression

 Amniotic fluid is normal and good fetal activity is seen
 .Antenatal testing is reassuring. BPP [DATE].

 Patient has chronic hypertension and takes labetalol.  Blood
 pressure today at her office is 129/70 mmHg.
Recommendations

 -Continue weekly BPP till delivery.
                Jim, [place]

## 2022-04-20 ENCOUNTER — Ambulatory Visit (INDEPENDENT_AMBULATORY_CARE_PROVIDER_SITE_OTHER): Payer: Commercial Managed Care - PPO | Admitting: Family Medicine

## 2022-04-20 ENCOUNTER — Encounter: Payer: Self-pay | Admitting: Family Medicine

## 2022-04-20 VITALS — BP 146/89 | HR 107 | Ht 62.0 in | Wt 196.0 lb

## 2022-04-20 DIAGNOSIS — N907 Vulvar cyst: Secondary | ICD-10-CM

## 2022-04-20 DIAGNOSIS — I10 Essential (primary) hypertension: Secondary | ICD-10-CM | POA: Diagnosis not present

## 2022-04-20 DIAGNOSIS — Z30011 Encounter for initial prescription of contraceptive pills: Secondary | ICD-10-CM

## 2022-04-20 MED ORDER — NIFEDIPINE ER 30 MG PO TB24: 30.0000 mg | ORAL_TABLET | Freq: Every day | ORAL | 3 refills | Status: DC

## 2022-04-20 MED ORDER — SLYND 4 MG PO TABS: 1.0000 | ORAL_TABLET | Freq: Every day | ORAL | 11 refills | Status: DC

## 2022-04-20 NOTE — Progress Notes (Signed)
GYNECOLOGY OFFICE VISIT NOTE  History:   Kimberly Farley is a 31 y.o. [phone] here today for follow up pap smear.  Patient had a pap in 03/2021 that was ASCUS with neg HPV, here for follow up testing in one year as previously recommended  Also has bump in R labia she would like to be examined Thinks it was not there prior to last delivery Doesn't hurt, just wants to know what it is  Also thinking about birth control Still breastfeeding, would like something compatible with that Has used pills successfully in the past  Needs refill on BP meds Reports she ran out a few days ago  Health Maintenance Due  Topic Date Due   INFLUENZA VACCINE  Never done   COVID-19 Vaccine (3 - 2023-24 season) 01/08/2022    Past Medical History:  Diagnosis Date   Hypertension     Past Surgical History:  Procedure Laterality Date   APPENDECTOMY     LAPAROSCOPIC APPENDECTOMY Right 12/31/2015   Procedure: APPENDECTOMY LAPAROSCOPIC;  Surgeon: Ovidio Kin, MD;  Location: [facility];  Service: General;  Laterality: Right;    The following portions of the patient's history were reviewed and updated as appropriate: allergies, current medications, past family history, past medical history, past social history, past surgical history and problem list.   Health Maintenance:   Last pap: Lab Results  Component Value Date   DIAGPAP (A) 03/20/2021    - Atypical squamous cells of undetermined significance (ASC-US)   HPVHIGH Negative 03/20/2021     Last mammogram:  N/a    Review of Systems:  Pertinent items noted in HPI and remainder of comprehensive ROS otherwise negative.  Physical Exam:  BP (!) 146/89   Pulse (!) 107   Ht 5\' 2"  (1.575 m)   Wt 196 lb (88.9 kg)   LMP  (LMP Unknown)   Breastfeeding Yes   BMI 35.85 kg/m  CONSTITUTIONAL: Well-developed, well-nourished female in no acute distress.  HEENT:  Normocephalic, atraumatic. External right and left ear normal. No scleral icterus.   NECK: Normal range of motion, supple, no masses noted on observation SKIN: No rash noted. Not diaphoretic. No erythema. No pallor. MUSCULOSKELETAL: Normal range of motion. No edema noted. NEUROLOGIC: Alert and oriented to person, place, and time. Normal muscle tone coordination.  PSYCHIATRIC: Normal mood and affect. Normal behavior. Normal judgment and thought content. RESPIRATORY: Effort normal, no problems with respiration noted PELVIC:  R labia with mobile, rubbery, ~1.5cm mobile mass  Labs and Imaging No results found for this or any previous visit (from the past 168 hour(s)). No results found.    Assessment and Plan:   Problem List Items Addressed This Visit       Cardiovascular and Mediastinum   Chronic hypertension    Uncontrolled but has not had meds for a few days, refill sent.       Relevant Medications   NIFEdipine (ADALAT CC) 30 MG 24 hr tablet     Genitourinary   Labial cyst - Primary    Benign exam, though not totally sure what it is. Ddx lipoma vs ingrown follicle vs inclusion cyst (last delivery note does mention R periuretheral being repaired). Either way not painful and has benign features, reassured patient no intervention needed but can be removed if she likes. She does not want to pursue removal at this time.         Other   Contraception management    After counseling on methods rx sent for  Slynd      Relevant Medications   Drospirenone (SLYND) 4 MG TABS    Routine preventative health maintenance measures emphasized. Please refer to After Visit Summary for other counseling recommendations.   Return in 1 year (on 04/21/2023) for Annual Wellness Visit.    Total face-to-face time with patient: 20 minutes.  Over 50% of encounter was spent on counseling and coordination of care.   Venora Maples, MD/MPH Attending Family Medicine Physician, [facility] for [facility], [facility]

## 2022-04-20 NOTE — Assessment & Plan Note (Signed)
Benign exam, though not totally sure what it is. Ddx lipoma vs ingrown follicle vs inclusion cyst (last delivery note does mention R periuretheral being repaired). Either way not painful and has benign features, reassured patient no intervention needed but can be removed if she likes. She does not want to pursue removal at this time.

## 2022-04-20 NOTE — Assessment & Plan Note (Signed)
After counseling on methods rx sent for [facility]

## 2022-04-20 NOTE — Patient Instructions (Signed)

## 2022-04-20 NOTE — Assessment & Plan Note (Signed)
Uncontrolled but has not had meds for a few days, refill sent.

## 2022-04-22 IMAGING — US US MFM FETAL BPP W/O NON-STRESS
1 series · 12 of 28 positions shown · non-contrast
Comparison: none

[Series 1: us mfm fetal bpp w/o non-stress · 37 acquisitions, 12 frames shown]
[im 2/37]
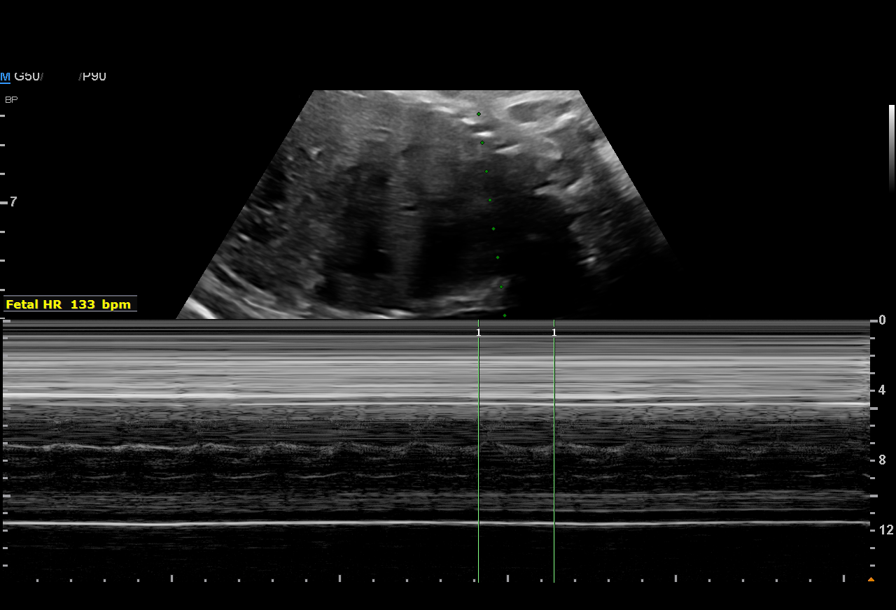
[im 5/37]
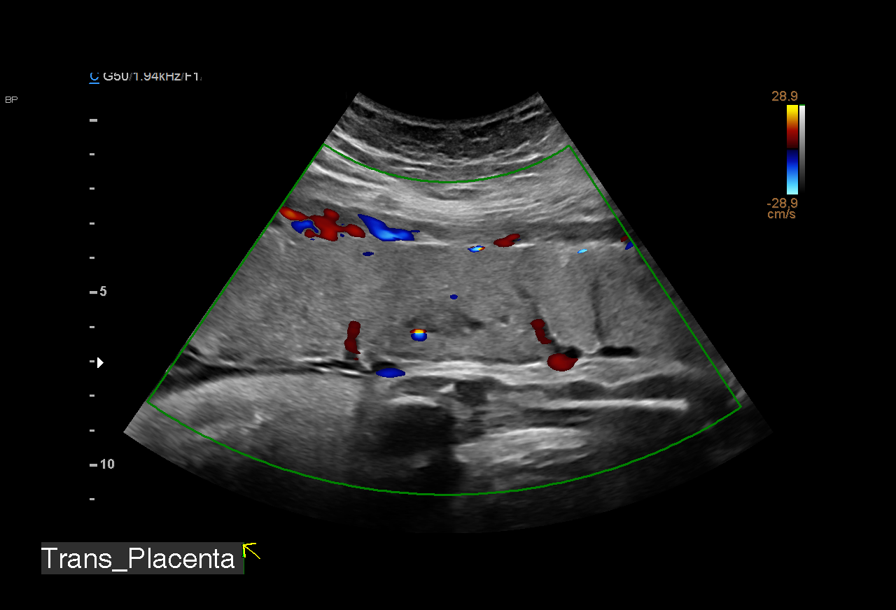
[im 7/37]
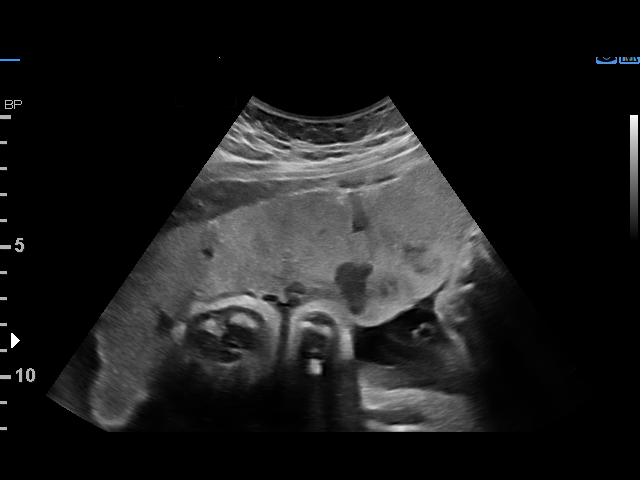
[im 11/37]
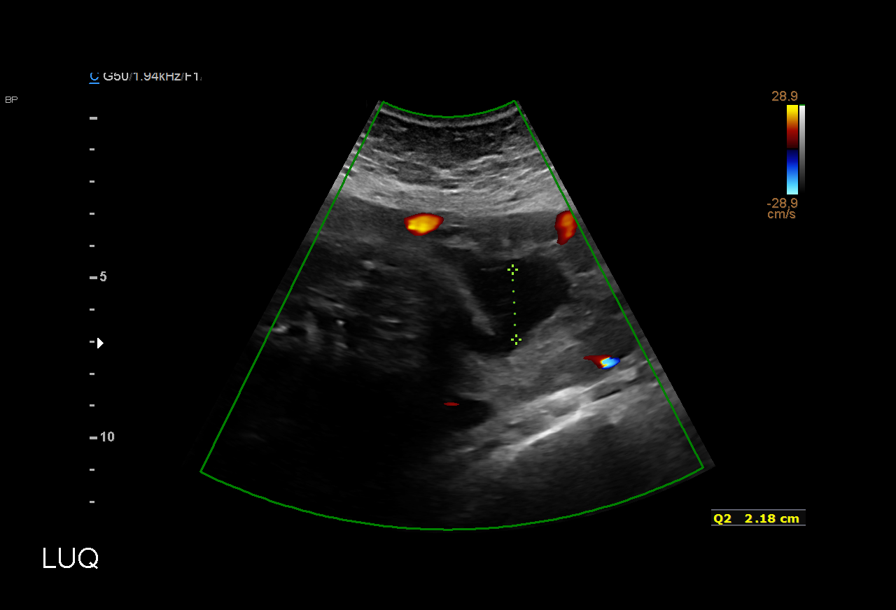
[im 14/37]
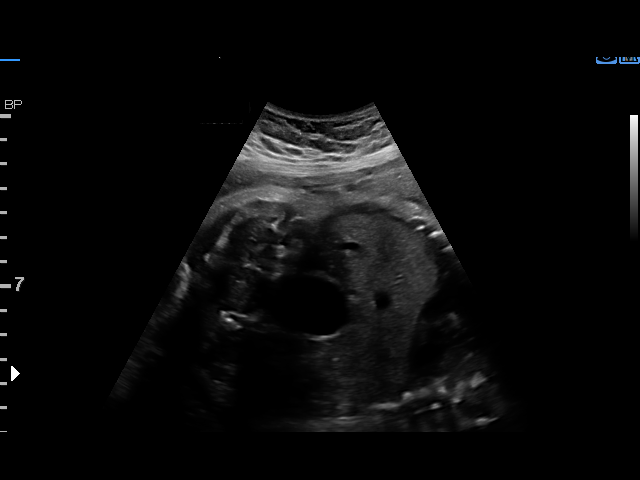
[im 17/37]
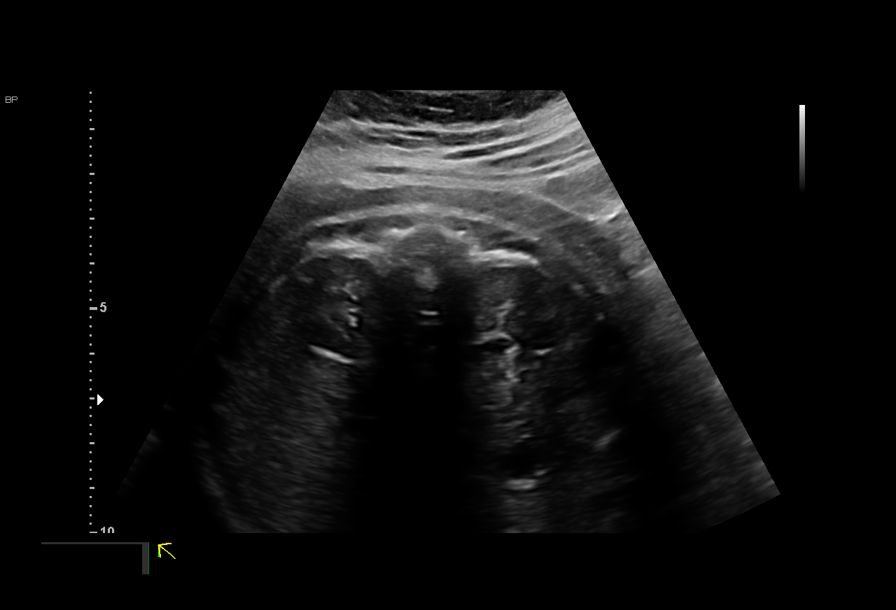
[im 21/37]
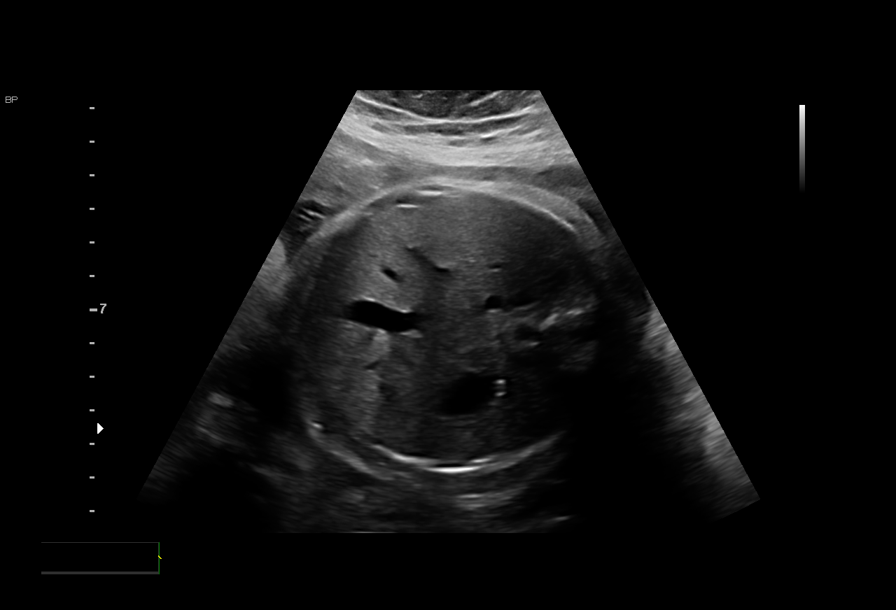
[im 23/37]
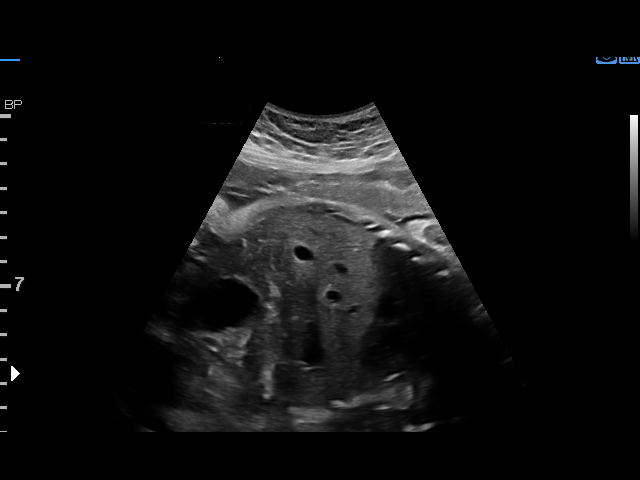
[im 26/37]
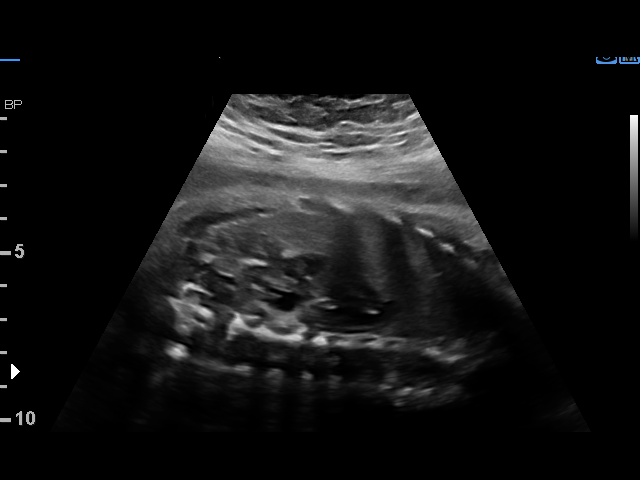
[im 30/37]
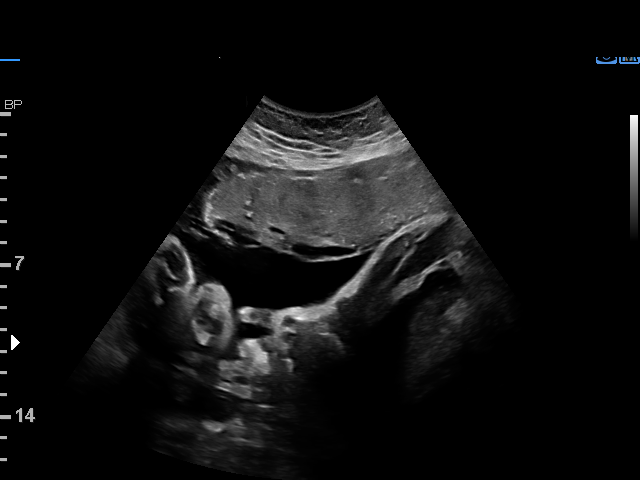
[im 33/37]
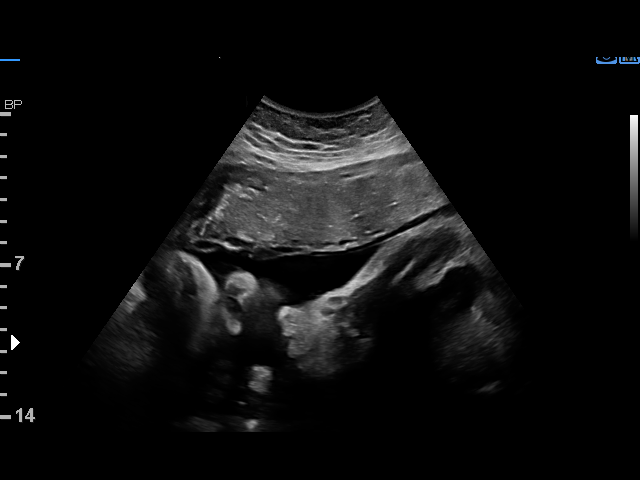
[im 35/37]
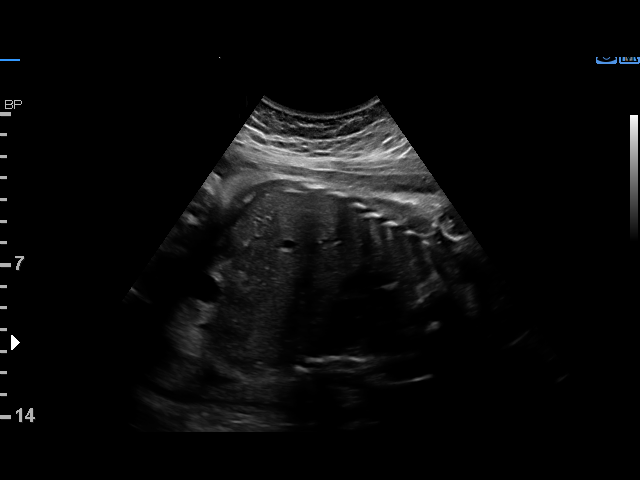

[12 of 28 positions shown; findings below may reference images not displayed]

Indications

 Hypertension - Chronic/Pre-existing
 (labetalol)
 Obesity complicating pregnancy, third
 trimester (pregravid BMI 34)
 35 weeks gestation of pregnancy
Fetal Evaluation

 Num Of Fetuses:         1
 Fetal Heart Rate(bpm):  133
 Cardiac Activity:       Observed
 Presentation:           Cephalic
 Placenta:               Anterior
 P. Cord Insertion:      Previously Visualized

 Amniotic Fluid
 AFI FV:      Within normal limits

 AFI Sum(cm)     %Tile       Largest Pocket(cm)
 10.93           28

 RUQ(cm)       RLQ(cm)       LUQ(cm)        LLQ(cm)
 5.67          0
Biophysical Evaluation
 Amniotic F.V:   Pocket => 2 cm             F. Tone:        Observed
 F. Movement:    Observed                   Score:          [DATE]
 F. Breathing:   Observed
OB History

 Gravidity:    2         Term:   1
 Living:       1
Gestational Age

 LMP:           35w 5d        Date:  12/17/20                  EDD:   09/23/21
 Best:          35w 5d     Det. By:  LMP  (12/17/20)          EDD:   09/23/21
Anatomy

 Cranium:               Appears normal         LVOT:                   Previously seen
 Cavum:                 Appears normal         Aortic Arch:            Previously seen
 Ventricles:            Previously seen        Ductal Arch:            Previously seen
 Choroid Plexus:        Previously seen        Diaphragm:              Appears normal
 Cerebellum:            Previously seen        Stomach:                Appears normal, left
                                                                       sided
 Posterior Fossa:       Previously seen        Abdomen:                Previously seen
 Nuchal Fold:           Previously seen        Abdominal Wall:         Previously seen
 Face:                  Orbits and profile     Cord Vessels:           Previously seen
                        previously seen
 Lips:                  Previously seen        Kidneys:                Appear normal
 Palate:                Previously seen        Bladder:                Appears normal
 Thoracic:              Appears normal         Spine:                  Previously seen
 Heart:                 Previously seen        Upper Extremities:      Previously seen
 RVOT:                  Previously seen        Lower Extremities:      Previously seen

 Other:  Female gender previously seen. Nasal bone, Heels, VC and 3VTV
         previously seen. Technically difficult due to maternal habitus.
Cervix Uterus Adnexa

 Cervix
 Not visualized (advanced GA >80wks)

 Uterus
 No abnormality visualized.

 Right Ovary
 Visualized.

 Left Ovary
 Visualized.
Impression
 Chronic hypertension. Well-controlled on labetalol .
 BP 130/83 mm Hg.

 Amniotic fluid is normal and good fetal activity is seen
 .Antenatal testing is reassuring. BPP [DATE]. Cephalic
 presentation.
Recommendations

 -Continue weekly BPP till delivery.
                Tiger, Alia

## 2022-04-27 ENCOUNTER — Ambulatory Visit: Payer: Medicaid Other | Admitting: Family Medicine

## 2023-05-11 NOTE — L&D Delivery Note (Signed)
 OB/GYN Faculty Practice Delivery Note  Kimberly Farley is a 33 y.o. [id number] s/p SVB at [redacted]w[redacted]d. She was admitted for IOL 2/2 cHTN.   ROM: 4h 39m with clear fluid GBS Status: negative Maximum Maternal Temperature: 98.3  Labor Progress: IOL with progression to complete s/p pitocin  and AROM  Delivery Date/Time: 01/16/24 @ 0154 Delivery: Called to room and patient was complete and pushing. Head delivered OA to LOA. No nuchal cord present, loose body cord. Shoulder and body delivered in usual fashion. Infant with spontaneous cry, placed on mother's abdomen, dried and stimulated. Cord clamped x 2 after 3-minute delay, and cut by Allen FOB. Cord blood drawn.  Labia, perineum, vagina, and cervix inspected, no laceration identified, excellent hemostasis and approximation noted.   Large clot expressed while waiting on birth of placenta. TXA ordered and given. Continued trickle, with placenta remaining in uterus. 800mcg of PR cytotec  given. Improvement in bleeding. After 30 minutes, placenta had still not been born. IV pain medication given (100mcg fentanyl ). Cord instilled with 10mu pitocin  followed by 10mL flush of sterile NS for placenta delivery, placenta still not out at 35 minutes and bleeding continued.  Ensured good pain control with IV medication, manual removal of placenta followed by extraction of several clots. Continued scant bleeding after placental removal. Call to Dr. Jayne to evaluate, at bedside he recommends methergine  (with labetalol  PRN for blood pressure treatment). Dr. Jayne re-examined placenta and agreed it was complete. Good uterine tone noted, bleeding slowed. Plan cytotec  series PP. Bleeding appears well controlled. Initial QBL approximately 650.  Informed of total QBL 1108 and small clots expressed. JADA and hemorrhage cart called, along with code hemorrhage overhead. IV pain medication called for and administered. PPH labs drawn and 2nd  IV started.  JADA inserted and bleeding well  controlled. Urinary catheter placed.   Placenta: manual removal of placenta, complete to pathology Complications: retained placenta, PPH Lacerations: No laceration identified excellent hemostasis and approximation noted QBL: 1108 Analgesia: nitrous, PP IV fentanyl   Postpartum Planning [ X] transfer orders to MB [facility].Gant ] discharge summary started & shared Galerius.Gant ] message to sent to schedule follow-up  Galerius.Gant ] lists updated -- Cytotec  series PP -- Clindamycin  and gentamicin  for prohylaxis due to manual extraction and PPH  Infant: boy Kimberly Farley 8/9  2863 g  Camie Rote, MSN, CNM, RNC-OB Certified Nurse Midwife, [facility] 01/16/2024 4:25 AM

## 2023-05-16 ENCOUNTER — Other Ambulatory Visit: Payer: Self-pay | Admitting: Family Medicine

## 2023-05-16 DIAGNOSIS — I10 Essential (primary) hypertension: Secondary | ICD-10-CM

## 2023-05-17 ENCOUNTER — Ambulatory Visit (INDEPENDENT_AMBULATORY_CARE_PROVIDER_SITE_OTHER): Payer: No Typology Code available for payment source

## 2023-05-17 DIAGNOSIS — Z3201 Encounter for pregnancy test, result positive: Secondary | ICD-10-CM

## 2023-05-17 DIAGNOSIS — Z3200 Encounter for pregnancy test, result unknown: Secondary | ICD-10-CM

## 2023-05-17 DIAGNOSIS — N912 Amenorrhea, unspecified: Secondary | ICD-10-CM

## 2023-05-17 LAB — POCT PREGNANCY, URINE: Preg Test, Ur: POSITIVE — AB

## 2023-05-17 MED ORDER — PRENATAL PLUS VITAMIN/MINERAL 27-1 MG PO TABS: 1.0000 | ORAL_TABLET | Freq: Every day | ORAL | 11 refills | Status: AC

## 2023-05-17 NOTE — Progress Notes (Signed)
 Possible Pregnancy  Patient dropped off urine today for pregnancy confirmation. UPT in office today is positive. Pt reports first positive home UPT on 05/14/2022. Reviewed dating with patient:   LMP: 03/27/2023 exact  EDD: 01/01/24 7w 2d today  OB history reviewed. Reviewed medications and allergies with patient; list of medications safe to take during pregnancy given via Mychart.  Recommended pt begin prenatal vitamin and schedule prenatal care. Patient requested to have prenatal vitamins sent to pharmacy. Pt denies any vaginal bleeding and/or abdominal pain. Advised patient to go to the MAU if she experiences any heavy vaginal bleeding and/or abdominal pain.   Dating and viability appointment scheduled for 05/25/23 at 9:15 AM. Pt confirmed scheduled appointment. Notified patient that the front office will reach out to schedule new OB intake.    Rosaline Pendleton, RN 05/17/2023  1:37 PM

## 2023-05-19 ENCOUNTER — Telehealth: Payer: Self-pay | Admitting: Family Medicine

## 2023-05-19 NOTE — Telephone Encounter (Signed)
 Left a detailed message about ultra sound appt change

## 2023-05-25 ENCOUNTER — Other Ambulatory Visit: Payer: Medicaid Other

## 2023-05-25 ENCOUNTER — Ambulatory Visit (HOSPITAL_COMMUNITY)
Admission: RE | Admit: 2023-05-25 | Discharge: 2023-05-25 | Disposition: A | Discharge status: Home / Self Care | Payer: Medicaid Other | Source: Ambulatory Visit | Attending: Family Medicine | Admitting: Family Medicine

## 2023-05-25 DIAGNOSIS — Z3200 Encounter for pregnancy test, result unknown: Secondary | ICD-10-CM

## 2023-05-27 ENCOUNTER — Telehealth: Payer: Self-pay | Admitting: Lactation Services

## 2023-05-27 DIAGNOSIS — I10 Essential (primary) hypertension: Secondary | ICD-10-CM

## 2023-05-27 MED ORDER — NIFEDIPINE ER 30 MG PO TB24: 30.0000 mg | ORAL_TABLET | Freq: Every day | ORAL | 3 refills | Status: AC

## 2023-05-27 NOTE — Telephone Encounter (Signed)
Received prescription refill request for Nifedipine. Patient is newly pregnant. She reports she has been taking since her last pregnant, she ran out 2-3 days ago.   Spoke with Albertine Grates, FNP who approved refill of medication. Pharmacy verified with patient. Rx sent.

## 2023-05-30 ENCOUNTER — Telehealth: Payer: Self-pay | Admitting: *Deleted

## 2023-05-30 DIAGNOSIS — O219 Vomiting of pregnancy, unspecified: Secondary | ICD-10-CM

## 2023-05-30 DIAGNOSIS — O3680X0 Pregnancy with inconclusive fetal viability, not applicable or unspecified: Secondary | ICD-10-CM

## 2023-05-30 MED ORDER — PROMETHAZINE HCL 25 MG PO TABS: 25.0000 mg | ORAL_TABLET | Freq: Four times a day (QID) | ORAL | 0 refills | Status: DC | PRN

## 2023-05-30 NOTE — Telephone Encounter (Signed)
-----   Message from Venora Maples sent at 05/26/2023  8:53 PM EST ----- US shows intrauterine pregnancy but will need follow up US to ensure progression as there is only a yolk sac at present, please schedule ultrasound in 11 days or greater.

## 2023-05-30 NOTE — Telephone Encounter (Signed)
I called and scheduled [place] for 06/06/23 at [facility],  arrive at 3pm. I called and notified Murjani of result and appointment. She voices understanding. She also c/o nausea. I offered med per protocol if needed. She states she would like nausea med. Rx for Phenergan sent in per protocol. Nancy Fetter

## 2023-06-06 ENCOUNTER — Ambulatory Visit (HOSPITAL_COMMUNITY)
Admission: RE | Admit: 2023-06-06 | Discharge: 2023-06-06 | Disposition: A | Discharge status: Home / Self Care | Payer: Medicaid Other | Source: Ambulatory Visit | Attending: Family Medicine | Admitting: Family Medicine

## 2023-06-06 DIAGNOSIS — O3680X0 Pregnancy with inconclusive fetal viability, not applicable or unspecified: Secondary | ICD-10-CM | POA: Insufficient documentation

## 2023-06-07 ENCOUNTER — Encounter (HOSPITAL_COMMUNITY): Payer: Self-pay | Admitting: Family Medicine

## 2023-06-07 ENCOUNTER — Encounter: Payer: Self-pay | Admitting: Family Medicine

## 2023-06-07 ENCOUNTER — Other Ambulatory Visit (HOSPITAL_COMMUNITY): Payer: Self-pay | Admitting: Family Medicine

## 2023-06-09 ENCOUNTER — Telehealth: Payer: Medicaid Other | Admitting: General Practice

## 2023-06-09 DIAGNOSIS — O0990 Supervision of high risk pregnancy, unspecified, unspecified trimester: Secondary | ICD-10-CM | POA: Insufficient documentation

## 2023-06-09 DIAGNOSIS — O0991 Supervision of high risk pregnancy, unspecified, first trimester: Secondary | ICD-10-CM

## 2023-06-09 DIAGNOSIS — Z3A01 Less than 8 weeks gestation of pregnancy: Secondary | ICD-10-CM

## 2023-06-09 MED ORDER — GOJJI WEIGHT SCALE MISC: 1.0000 | Freq: Every day | 0 refills | Status: AC

## 2023-06-09 MED ORDER — BLOOD PRESSURE KIT DEVI: 1.0000 | Freq: Every day | 0 refills | Status: AC

## 2023-06-09 NOTE — Progress Notes (Signed)
New OB Intake  I connected with Manus Gunning  on 06/09/23 at  1:15 PM EST by MyChart Video Visit and verified that I am speaking with the correct person using two identifiers. Nurse is located at [facility] and pt is located at home.  I discussed the limitations, risks, security and privacy concerns of performing an evaluation and management service by telephone and the availability of in person appointments. I also discussed with the patient that there may be a patient responsible charge related to this service. The patient expressed understanding and agreed to proceed.  I explained I am completing New OB Intake today. We discussed EDD of 01/23/2024, by Last Menstrual Period. Pt is G3P2002. I reviewed her allergies, medications and Medical/Surgical/OB history.    Patient Active Problem List   Diagnosis Date Noted   Labial cyst 04/20/2022   Atypical squamous cell changes of undetermined significance (ASCUS) with negative high risk human papilloma virus (HPV) pap smear 03/20/21 03/27/2021   Prediabetes 03/23/2021   Chronic hypertension 03/20/2021    Concerns addressed today none  Delivery Plans Plans to deliver at [facility]. Discussed the nature of our practice with multiple providers including residents and students. Due to the size of the practice, the delivering provider may not be the same as those providing prenatal care.   Patient is interested in water birth. Offered upcoming OB visit with CNM to discuss further.  MyChart/Babyscripts MyChart access verified. I explained pt will have some visits in office and some virtually. Babyscripts instructions given and order placed. Patient verifies receipt of registration text/e-mail. Account successfully created and app downloaded. If patient is a candidate for Optimized scheduling, add to sticky note.   Blood Pressure Cuff/Weight Scale Blood pressure cuff ordered for patient to pick-up from [employer]. Explained after first prenatal  appt pt will check weekly and document in Babyscripts. Patient does not have weight scale; order sent to [facility], patient may track weight weekly in Babyscripts.  Anatomy US Explained first scheduled [place] will be around 19 weeks. Anatomy US scheduled for April 23 at 10:15.  Is patient a CenteringPregnancy candidate?  Declined Declined due to Declined to say  Is patient a Mom+Baby Combined Care candidate?  Not a candidate     Interested in [place]? If yes, send referral and doula dot phrase.  Maybe- will let us know   Is patient a candidate for Babyscripts Optimization? No, due to [facility]   First visit review I reviewed new OB appt with patient. Explained pt will be seen by Albertine Grates at first visit. Discussed Avelina Laine genetic screening with patient. Routine prenatal labs will be drawn at new OB visit along with pap. Patient would like Panorama when far enough along- had horizon in previous pregnancy  Last Pap Diagnosis  Date Value Ref Range Status  03/20/2021 (A)  Final   - Atypical squamous cells of undetermined significance (ASC-US)    Marylynn Pearson, RN 06/09/2023  1:28 PM

## 2023-06-16 ENCOUNTER — Other Ambulatory Visit: Payer: Self-pay

## 2023-06-16 ENCOUNTER — Encounter: Payer: Medicaid Other | Admitting: Obstetrics and Gynecology

## 2023-07-01 ENCOUNTER — Encounter: Payer: Medicaid Other | Admitting: Obstetrics and Gynecology

## 2023-07-13 ENCOUNTER — Ambulatory Visit: Admitting: Family Medicine

## 2023-07-13 ENCOUNTER — Other Ambulatory Visit (HOSPITAL_COMMUNITY)
Admission: RE | Admit: 2023-07-13 | Discharge: 2023-07-13 | Disposition: A | Discharge status: Home / Self Care | Source: Ambulatory Visit | Attending: Family Medicine | Admitting: Family Medicine

## 2023-07-13 ENCOUNTER — Encounter: Payer: Medicaid Other | Admitting: Family Medicine

## 2023-07-13 VITALS — BP 137/102 | HR 105 | Wt 175.0 lb

## 2023-07-13 DIAGNOSIS — O0991 Supervision of high risk pregnancy, unspecified, first trimester: Secondary | ICD-10-CM | POA: Diagnosis not present

## 2023-07-13 DIAGNOSIS — O0990 Supervision of high risk pregnancy, unspecified, unspecified trimester: Secondary | ICD-10-CM | POA: Insufficient documentation

## 2023-07-13 DIAGNOSIS — Z3A12 12 weeks gestation of pregnancy: Secondary | ICD-10-CM

## 2023-07-13 NOTE — Patient Instructions (Signed)
   Considering Waterbirth? Guide for patients at [facility]) Why consider waterbirth? Gentle birth for babies  Less pain medicine used in labor  May allow for passive descent/less pushing  May reduce perineal tears  More mobility and instinctive maternal position changes  Increased maternal relaxation   Is waterbirth safe? What are the risks of infection, drowning or other complications? Infection:  Very low risk (3.7 % for tub vs 4.8% for bed)  7 in 8000 waterbirths with documented infection  Poorly cleaned equipment most common cause  Slightly lower group B strep transmission rate  Drowning  Maternal:  Very low risk  Related to seizures or fainting  Newborn:  Very low risk. No evidence of increased risk of respiratory problems in multiple large studies  Physiological protection from breathing under water  Avoid underwater birth if there are any fetal complications  Once baby's head is out of the water, keep it out.  Birth complication  Some reports of cord trauma, but risk decreased by bringing baby to surface gradually  No evidence of increased risk of shoulder dystocia. Mothers can usually change positions faster in water than in a bed, possibly aiding the maneuvers to free the shoulder.   There are 2 things you MUST do to have a waterbirth with [facility]: Attend a waterbirth class at [facility]   3rd Wednesday of every month from 7-9 pm (virtual during COVID) [employer] at [email or web address] or [email or web address] or by calling [phone] Bring [place] the certificate from the class to your prenatal appointment or send via MyChart Meet with a midwife at 36 weeks* to see if you can still plan a waterbirth and to sign the consent.   *We also recommend that you schedule as many of your prenatal visits with a midwife as possible.    Helpful information: You may want to bring a bathing suit top to the hospital  to wear during labor but this is optional.  All other supplies are provided by the hospital. Please arrive at the hospital with signs of active labor, and do not wait at home until late in labor. It takes 45 min- 1 hour for fetal monitoring, and check in to your room to take place, plus transport and filling of the waterbirth tub.    Things that would prevent you from having a waterbirth: Premature, <37wks  Previous cesarean birth  Presence of thick meconium-stained fluid  Multiple gestation (Twins, triplets, etc.)  Uncontrolled diabetes or gestational diabetes requiring medication  Hypertension diagnosed in pregnancy or preexisting hypertension (gestational hypertension, preeclampsia, or chronic hypertension) Fetal growth restriction (your baby measures less than 10th percentile on ultrasound) Heavy vaginal bleeding  Non-reassuring fetal heart rate  Active infection (MRSA, etc.). Group B Strep is NOT a contraindication for waterbirth.  If your labor has to be induced and induction method requires continuous monitoring of the baby's heart rate  Other risks/issues identified by your obstetrical provider   Please remember that birth is unpredictable. Under certain unforeseeable circumstances your provider may advise against giving birth in the tub. These decisions will be made on a case-by-case basis and with the safety of you and your baby as our highest priority.    Updated 08/12/21

## 2023-07-13 NOTE — Progress Notes (Signed)
   INITIAL PRENATAL VISIT NOTE  Subjective:  Kimberly Farley is a 33 y.o. G3P2002 at [redacted]w[redacted]d being seen today for ongoing prenatal care.  She is currently monitored for the following issues for this low-risk pregnancy and has Chronic hypertension; Prediabetes; Atypical squamous cell changes of undetermined significance (ASCUS) with negative high risk human papilloma virus (HPV) pap smear 03/20/21; Labial cyst; and Supervision of high risk pregnancy, antepartum on their problem list.  Patient reports no complaints.  Contractions: Not present. Vag. Bleeding: None.  Movement: Absent. Denies leaking of fluid.   The following portions of the patient's history were reviewed and updated as appropriate: allergies, current medications, past family history, past medical history, past social history, past surgical history and problem list.   Objective:   Vitals:   07/13/23 1048  BP: (!) 136/91  Pulse: (!) 107  Weight: 175 lb (79.4 kg)    Fetal Status: Fetal Heart Rate (bpm): 160   Movement: Absent     General:  Alert, oriented and cooperative. Patient is in no acute distress.  Skin: Skin is warm and dry. No rash noted.   Cardiovascular: Normal heart rate noted  Respiratory: Normal respiratory effort, no problems with respiration noted  Abdomen: Soft, gravid, appropriate for gestational age.  Pain/Pressure: Absent     Pelvic: Normal external genetalia. Normal vaginal mucosa. Pap collected         Extremities: Normal range of motion.  Edema: None  Mental Status: Normal mood and affect. Normal behavior. Normal judgment and thought content.   Assessment and Plan:  Pregnancy: G3P2002 at [redacted]w[redacted]d 1. Supervision of high risk pregnancy, antepartum (Primary) - Cytology - PAP( [place]) - Hemoglobin A1c - Culture, OB Urine - CBC/D/Plt+RPR+Rh+ABO+RubIgG... - Protein / creatinine ratio, urine - TSH - Comprehensive metabolic panel  Considering WB-- however patient does have diagnosis of cHTN and  requited labetalol in prior pregnancy. Risks out of waterbirth Reviewed reproductive life planning- will desires BTL but partner is planning Vasectomy. If he gets this she will not want BTS Reviewed practice model Discussed care today with labs, navigator  2. [redacted] weeks gestation of pregnancy  3. High-risk pregnancy - PANORAMA PRENATAL TEST  Preterm labor symptoms and general obstetric precautions including but not limited to vaginal bleeding, contractions, leaking of fluid and fetal movement were reviewed in detail with the patient. Please refer to After Visit Summary for other counseling recommendations.   No follow-ups on file.  Future Appointments  Date Time Provider Department Center  08/31/2023 10:15 AM [facility] NURSE [facility]  08/31/2023 10:30 AM [facility] US3 [facility]  08/31/2023 11:00 AM [facility] PROVIDER 1 [facility]    Federico Flake, MD

## 2023-07-13 NOTE — Addendum Note (Signed)
 Addended byVidal Schwalbe on: 07/13/2023 11:49 AM   Modules accepted: Orders

## 2023-07-14 LAB — COMPREHENSIVE METABOLIC PANEL
ALT: 14 IU/L (ref 0–32)
AST: 16 IU/L (ref 0–40)
Albumin: 4.2 g/dL (ref 4.0–5.0)
Alkaline Phosphatase: 63 IU/L (ref 44–121)
BUN/Creatinine Ratio: 13 (ref 9–23)
BUN: 9 mg/dL (ref 6–20)
Bilirubin Total: 0.3 mg/dL (ref 0.0–1.2)
CO2: 20 mmol/L (ref 20–29)
Calcium: 10 mg/dL (ref 8.7–10.2)
Chloride: 99 mmol/L (ref 96–106)
Creatinine, Ser: 0.69 mg/dL (ref 0.57–1.00)
Globulin, Total: 2.9 g/dL (ref 1.5–4.5)
Glucose: 79 mg/dL (ref 70–99)
Potassium: 4.1 mmol/L (ref 3.5–5.2)
Sodium: 133 mmol/L — ABNORMAL LOW (ref 134–144)
Total Protein: 7.1 g/dL (ref 6.0–8.5)
eGFR: 120 mL/min/{1.73_m2} (ref >59)

## 2023-07-14 LAB — CBC/D/PLT+RPR+RH+ABO+RUBIGG...
Antibody Screen: NEGATIVE
Basophils Absolute: 0 10*3/uL (ref 0.0–0.2)
Basos: 0 %
EOS (ABSOLUTE): 0.1 10*3/uL (ref 0.0–0.4)
Eos: 1 %
HCV Ab: NONREACTIVE
HIV Screen 4th Generation wRfx: NONREACTIVE
Hematocrit: 44.3 % (ref 34.0–46.6)
Hemoglobin: 14.1 g/dL (ref 11.1–15.9)
Hepatitis B Surface Ag: NEGATIVE
Immature Grans (Abs): 0 10*3/uL (ref 0.0–0.1)
Immature Granulocytes: 0 %
Lymphocytes Absolute: 3.1 10*3/uL (ref 0.7–3.1)
Lymphs: 28 %
MCH: 29 pg (ref 26.6–33.0)
MCHC: 31.8 g/dL (ref 31.5–35.7)
MCV: 91 fL (ref 79–97)
Monocytes Absolute: 0.7 10*3/uL (ref 0.1–0.9)
Monocytes: 6 %
Neutrophils Absolute: 7 10*3/uL (ref 1.4–7.0)
Neutrophils: 65 %
Platelets: 361 10*3/uL (ref 150–450)
RBC: 4.86 x10E6/uL (ref 3.77–5.28)
RDW: 15.8 % — ABNORMAL HIGH (ref 11.7–15.4)
RPR Ser Ql: NONREACTIVE
Rh Factor: POSITIVE
Rubella Antibodies, IGG: 25.7 {index} (ref >0.99)
WBC: 10.9 10*3/uL — ABNORMAL HIGH (ref 3.4–10.8)

## 2023-07-14 LAB — PROTEIN / CREATININE RATIO, URINE
Creatinine, Urine: 208.2 mg/dL
Protein, Ur: 13.1 mg/dL
Protein/Creat Ratio: 63 mg/g{creat} (ref 0–200)

## 2023-07-14 LAB — HEMOGLOBIN A1C
Est. average glucose Bld gHb Est-mCnc: 117 mg/dL
Hgb A1c MFr Bld: 5.7 % — ABNORMAL HIGH (ref 4.8–5.6)

## 2023-07-14 LAB — URINE CULTURE, OB REFLEX: Organism ID, Bacteria: NO GROWTH

## 2023-07-14 LAB — CULTURE, OB URINE

## 2023-07-14 LAB — HCV INTERPRETATION

## 2023-07-14 LAB — TSH: TSH: 0.43 u[IU]/mL — ABNORMAL LOW (ref 0.450–4.500)

## 2023-07-15 ENCOUNTER — Encounter: Payer: Self-pay | Admitting: Family Medicine

## 2023-07-18 ENCOUNTER — Encounter: Payer: Self-pay | Admitting: *Deleted

## 2023-07-18 ENCOUNTER — Telehealth: Payer: Self-pay | Admitting: *Deleted

## 2023-07-18 DIAGNOSIS — O0990 Supervision of high risk pregnancy, unspecified, unspecified trimester: Secondary | ICD-10-CM

## 2023-07-18 DIAGNOSIS — R7303 Prediabetes: Secondary | ICD-10-CM

## 2023-07-18 LAB — CYTOLOGY - PAP
Chlamydia: NEGATIVE
Comment: NEGATIVE
Comment: NEGATIVE
Comment: NORMAL
Diagnosis: NEGATIVE
High risk HPV: NEGATIVE
Neisseria Gonorrhea: NEGATIVE

## 2023-07-18 NOTE — Telephone Encounter (Signed)
-----   Message from Federico Flake sent at 07/15/2023  1:07 PM EST ----- Needs early GTT scheduled due to prediabetes.   Please add on T3/T4 to her TSH

## 2023-07-18 NOTE — Telephone Encounter (Addendum)
 I called Geniece and informed her of results and need for early glucose test . She states she can't do this week but agreed to an appointment on 07/27/23. Also asked lab to add labs as requested.  Nancy Fetter

## 2023-07-18 NOTE — Addendum Note (Signed)
 Addended by: Gerome Apley on: 07/18/2023 11:46 AM   Modules accepted: Orders

## 2023-07-19 ENCOUNTER — Encounter: Payer: Self-pay | Admitting: Family Medicine

## 2023-07-19 LAB — PANORAMA PRENATAL TEST FULL PANEL:PANORAMA TEST PLUS 5 ADDITIONAL MICRODELETIONS: FETAL FRACTION: 7.1

## 2023-07-21 ENCOUNTER — Other Ambulatory Visit: Payer: Self-pay

## 2023-07-21 DIAGNOSIS — Z3A13 13 weeks gestation of pregnancy: Secondary | ICD-10-CM

## 2023-07-27 ENCOUNTER — Other Ambulatory Visit

## 2023-07-27 ENCOUNTER — Other Ambulatory Visit: Payer: Self-pay

## 2023-07-27 DIAGNOSIS — Z3A13 13 weeks gestation of pregnancy: Secondary | ICD-10-CM

## 2023-07-28 ENCOUNTER — Encounter: Payer: Self-pay | Admitting: Certified Nurse Midwife

## 2023-07-28 LAB — GLUCOSE TOLERANCE, 2 HOURS W/ 1HR
Glucose, 1 hour: 102 mg/dL (ref 70–179)
Glucose, 2 hour: 104 mg/dL (ref 70–152)
Glucose, Fasting: 79 mg/dL (ref 70–91)

## 2023-08-07 NOTE — Progress Notes (Unsigned)
   PRENATAL VISIT NOTE  Subjective:  Kimberly Farley is a 33 y.o. G3P2002 at [redacted]w[redacted]d being seen today for ongoing prenatal care.  She is currently monitored for the following issues for this {Blank single:19197::"high-risk","low-risk"} pregnancy and has Chronic hypertension; Prediabetes; Atypical squamous cell changes of undetermined significance (ASCUS) with negative high risk human papilloma virus (HPV) pap smear 03/20/21; Labial cyst; and Supervision of high risk pregnancy, antepartum on their problem list.  Patient reports {sx:[id number]}.   .  .   . Denies leaking of fluid.   The following portions of the patient's history were reviewed and updated as appropriate: allergies, current medications, past family history, past medical history, past social history, past surgical history and problem list.   Objective:  There were no vitals filed for this visit.  Fetal Status:           General:  Alert, oriented and cooperative. Patient is in no acute distress.  Skin: Skin is warm and dry. No rash noted.   Cardiovascular: Normal heart rate noted  Respiratory: Normal respiratory effort, no problems with respiration noted  Abdomen: Soft, gravid, appropriate for gestational age.        Pelvic: {Blank single:19197::"Cervical exam performed in the presence of a chaperone","Cervical exam deferred"}        Extremities: Normal range of motion.     Mental Status: Normal mood and affect. Normal behavior. Normal judgment and thought content.   Assessment and Plan:  Pregnancy: G3P2002 at [redacted]w[redacted]d 1. Supervision of high risk pregnancy, antepartum (Primary) ***  2. [redacted] weeks gestation of pregnancy ***  3. Unwanted birth ***  {Blank single:19197::"Term","Preterm"} labor symptoms and general obstetric precautions including but not limited to vaginal bleeding, contractions, leaking of fluid and fetal movement were reviewed in detail with the patient. Please refer to After Visit Summary for other counseling  recommendations.   No follow-ups on file.  Future Appointments  Date Time Provider Department Center  08/09/2023 10:55 AM [facility]  08/31/2023 10:15 AM [facility] NURSE [facility]  08/31/2023 10:30 AM [facility] US3 [facility]  08/31/2023 11:00 AM [facility] PROVIDER 1 [facility]    Richardson Landry, CNM

## 2023-08-09 ENCOUNTER — Other Ambulatory Visit: Payer: Self-pay

## 2023-08-09 ENCOUNTER — Other Ambulatory Visit: Payer: Self-pay | Admitting: Family Medicine

## 2023-08-09 ENCOUNTER — Ambulatory Visit (INDEPENDENT_AMBULATORY_CARE_PROVIDER_SITE_OTHER): Admitting: Certified Nurse Midwife

## 2023-08-09 VITALS — BP 132/87 | HR 118 | Wt 180.8 lb

## 2023-08-09 DIAGNOSIS — O0990 Supervision of high risk pregnancy, unspecified, unspecified trimester: Secondary | ICD-10-CM

## 2023-08-09 DIAGNOSIS — Z3A16 16 weeks gestation of pregnancy: Secondary | ICD-10-CM | POA: Diagnosis not present

## 2023-08-09 DIAGNOSIS — Z3009 Encounter for other general counseling and advice on contraception: Secondary | ICD-10-CM | POA: Diagnosis not present

## 2023-08-09 DIAGNOSIS — O0992 Supervision of high risk pregnancy, unspecified, second trimester: Secondary | ICD-10-CM

## 2023-08-09 DIAGNOSIS — R7989 Other specified abnormal findings of blood chemistry: Secondary | ICD-10-CM

## 2023-08-09 DIAGNOSIS — Z640 Problems related to unwanted pregnancy: Secondary | ICD-10-CM

## 2023-08-09 LAB — T4: T4, Total: 12.4 ug/dL — ABNORMAL HIGH (ref 4.5–12.0)

## 2023-08-09 LAB — SPECIMEN STATUS REPORT

## 2023-08-09 LAB — T3, FREE: T3, Free: 3.2 pg/mL (ref 2.0–4.4)

## 2023-08-09 NOTE — Patient Instructions (Signed)
 Round Ligament Pain During Pregnancy Round ligament pain is a sharp pain or jabbing feeling often felt in the lower belly or groin area on one or both sides. It is one of the most common complaints during pregnancy and is considered a normal part of pregnancy. It is most often felt during the second trimester.  Here is what you need to know about round ligament pain, including some tips to help you feel better.  Causes of Round Ligament Pain  Several thick ligaments surround and support your womb (uterus) as it grows during pregnancy. One of them is called the round ligament.  The round ligament connects the front part of the womb to your groin, the area where your legs attach to your pelvis. The round ligament normally tightens and relaxes slowly.  As your baby and womb grow, the round ligament stretches. That makes it more likely to become strained.  Sudden movements can cause the ligament to tighten quickly, like a rubber band snapping. This causes a sudden and quick jabbing feeling.  Symptoms of Round Ligament Pain  Round ligament pain can be concerning and uncomfortable. But it is considered normal as your body changes during pregnancy.  The symptoms of round ligament pain include a sharp, sudden spasm in the belly. It usually affects the right side, but it may happen on both sides. The pain only lasts a few seconds.  Exercise may cause the pain, as will rapid movements such as:  sneezing coughing laughing rolling over in bed standing up too quickly  Comfort Measures:  Soak in a warm tub Tylenol 1000 mg by mouth every 6-8 hrs as needed for pain Slow position changes Laying on the affected side    Massage: Starting at the middle of your pubic bone, trace little circles in a wide U from your pubic bone to your hip bones on both sides.  Then starting just above your pubic bone, press in and down, alternating sides to create a gentle rocking of your uterus back and forth.   Move your hands up the sides of your belly and back down. Do this 3-5 times upon waking and before bed.   Stretches: Get on hands and knees and alternate arching your back deeply while inhaling, and then rounding your back while exhaling. Modified runners lunge:  - Sit on a chair with half of your bottom on the chair and half off.  - Sit up tall, plant your front foot, and stretch your other foot out behind you.  - Breathe deeply for 5 breaths and then do the other side.   Also chiropractors and massages are safe in pregnancy. [facility] in [place] specializes in pregnancy care. You can visit their website at: [email or web address] to schedule a new patient chiropractic treatment (1 hour) appointment. Please let us know if you have any other questions or concerns.  Maternity Support [employer] can purchase on [place] or through [place]. Below is an example.     Considering Waterbirth? Guide for patients at Center for [employer] [facility]) Why consider waterbirth? Gentle birth for babies  Less pain medicine used in labor  May allow for passive descent/less pushing  May reduce perineal tears  More mobility and instinctive maternal position changes  Increased maternal relaxation   Is waterbirth safe? What are the risks of infection, drowning or other complications? Infection:  Very low risk (3.7 % for tub vs 4.8% for bed)  7 in 8000 waterbirths with documented infection  Poorly cleaned equipment most  common cause  Slightly lower group B strep transmission rate  Drowning  Maternal:  Very low risk  Related to seizures or fainting  Newborn:  Very low risk. No evidence of increased risk of respiratory problems in multiple large studies  Physiological protection from breathing under water  Avoid underwater birth if there are any fetal complications  Once baby's head is out of the water, keep it out.  Birth complication  Some reports of cord  trauma, but risk decreased by bringing baby to surface gradually  No evidence of increased risk of shoulder dystocia. Mothers can usually change positions faster in water than in a bed, possibly aiding the maneuvers to free the shoulder.   There are 2 things you MUST do to have a waterbirth with [facility]: Attend a waterbirth class at [employer] [facility] at [facility]   3rd Wednesday of every month from 7-9 pm (virtual during COVID) [employer] at [email or web address] or [email or web address] or by calling [phone] Bring [place] the certificate from the class to your prenatal appointment or send via MyChart Meet with a midwife at 36 weeks* to see if you can still plan a waterbirth and to sign the consent.   *We also recommend that you schedule as many of your prenatal visits with a midwife as possible.    Helpful information: You may want to bring a bathing suit top to the hospital to wear during labor but this is optional.  All other supplies are provided by the hospital. Please arrive at the hospital with signs of active labor, and do not wait at home until late in labor. It takes 45 min- 1 hour for fetal monitoring, and check in to your room to take place, plus transport and filling of the waterbirth tub.    Things that would prevent you from having a waterbirth: Premature, <37wks  Previous cesarean birth  Presence of thick meconium-stained fluid  Multiple gestation (Twins, triplets, etc.)  Uncontrolled diabetes or gestational diabetes requiring medication  Hypertension diagnosed in pregnancy or preexisting hypertension (gestational hypertension, preeclampsia, or chronic hypertension) Fetal growth restriction (your baby measures less than 10th percentile on ultrasound) Heavy vaginal bleeding  Non-reassuring fetal heart rate  Active infection (MRSA, etc.). Group B Strep is NOT a contraindication for waterbirth.  If your labor has to be induced and induction method  requires continuous monitoring of the baby's heart rate  Other risks/issues identified by your obstetrical provider   Please remember that birth is unpredictable. Under certain unforeseeable circumstances your provider may advise against giving birth in the tub. These decisions will be made on a case-by-case basis and with the safety of you and your baby as our highest priority.    Updated 08/12/21  We highly recommend childbirth education to help you plan for labor and begin practicing coping skills (which will be needed with or without pain meds).  [place] Childbirth Education Options: Sign up by visiting [email or web address]  Childbirth ~ Self-Paced eClass (English and Spanish) This online class offers you the freedom to complete a childbirth education series in the comfort of your own home at your own pace.  Childbirth Class (In-Person 4-Week Series  or on Saturdays, Virtual 4-Week Series ~ [place]) This interactive in-person class series will help you and your partner prepare for your birth experience. Topics include: Labor & Birth, Comfort Measures, Breathing Techniques, Massage, Medical Interventions, Pain Management Options, Cesarean Birth, Postpartum Care, and Newborn Care  Comfort Techniques for Labor ~ In-Person Class [facility]) This  interactive class is designed for parents-to-be who want to learn & practice hands-on skills to help relieve some of the discomfort of labor and encourage their babies to rotate toward the best position for birth. Moms and their partners will be able to try a variety of labor positions with birth balls and rebozos as well as practice breathing, relaxation, and visualization techniques.  Natural Childbirth Class (In-Person 5-Week Series, In-Person on Saturdays or Virtual 5-Week Series ~ [place]) This class series is designed for expectant parents who want to learn and practice natural methods of coping with the process of labor and  childbirth.  Cesarean Birth Self-Paced eClass (English and Spanish) This online course provides comprehensive information you can trust as you prepare for a possible cesarean birth. In this class, you'll learn how to make your birth and recovery comfortable and joyful through instructive video clips, animations, and activities.  Waterbirth ~ Airline pilot Interested in a waterbirth? In addition to a consultation with your credentialed waterbirth provider, this free, informational online class will help you discover whether waterbirth is the right fit for you. Not all obstetrical practices offer waterbirth, so check with your healthcare provider.  Tour Probation officer) - [facility] [employer] our 4 minute video tour of [facility] located in [place].   [place] Parenting Education Options:  Pregnancy 101 (Virtual) Congratulations on your pregnancy! This class is geared toward moms in their first trimester, but everyone is welcome. We are excited to guide you through all aspects of supporting a healthy pregnancy. You will learn what to expect at routine prenatal care appointments, common postpartum adjustments, basic infant safety, and breastfeeding.  Successful Partnering & Parenting ~ In-Person Workshop [facility]) This workshop inspires and equips partners of all economic levels, ages, and cultures to confidently care for their infants, support the birthing persons, and navigate their own transformations into new partners and parents. Learning activities are geared towards supporting partner, but moms are welcome to attend.  'Baby & Me' Parenting Group (Virtual on Wednesdays at 11am) Enjoy this time discussing newborn & infant parenting topics and family adjustment issues with other new parents in a relaxed environment. Each week brings a new speaker or baby-centered activity. This group offers support and connection to parents as  they journey through the adjustments and struggles of that sometimes overwhelming first year after the birth of a child.  Baby Safety, CPR, & Choking Class ~ Virtual This life-saving information is meant to encourage parents as they learn important safety and prevention tips as well as infant CPR and relief of choking.  Breastfeeding Class (In-Person in [place] or [employer]) Families learn what to expect in the first days and weeks of breastfeeding your newborn.   Breastfeeding Self-Paced eClass (English & Spanish) Families learn what to expect in the first days and weeks of breastfeeding your newborn.  Caring for Baby ~ In-Person, Virtual or Self-Paced Class This in-person class is for both expectant and adoptive parents who want to learn and practice the most up-to-date newborn care for their babies. Focus is on birth through the first six weeks of life.  CPR & Choking Relief for Infants & Children ~ In-Person Class [facility]) This in-person course is designed for any parent, expectant parent, or adult who cares for infants or children. Participants learn and demonstrate cardiopulmonary resuscitation and choking relief procedures for both infants and children.  Grandparent Love ~ In-Person Class Grandparents will learn the most updated infant care and safety  recommendations. They will discover ways to support their own children during the transition into the parenting role and receive tips on communicating with the new parents.  [place] Parenting Support Group Options:  Bereavement Grief Support Group (Pregnancy/Infant Loss) - Virtual This is an ongoing experience that meets once a month and is designed to help you honor the past, assist you in discovering tools to strengthen you today, and aid you in developing hope for the future.  Breastfeeding & Pumping Support Group (In-Person on Thursdays at 12pm or Virtual on Tuesdays at 5pm) Join [place] in-person each Thursday starting June  1st, 2023 at 12pm! This support group is free for all families looking for breastfeeding and/or pumping support.   Community-Based Childbirth Education Options:  [facility] Department Classes:  Childbirth education classes can help you get ready for a positive parenting experience. You can also meet other expectant parents and get free stuff for your baby. Each class runs for five weeks on the same night and costs $45 for the mother-to-be and her support person. Medicaid covers the cost if you are eligible. Call [phone] to register.  [facility] [employer] offers a variety of programs for the [employer] and is another great way to get connected. Please go to [email or web address] for more information.  Childbirth With A Twist! Be informed of your options, get educated on birth, understand what your body is doing, learn how to cope, and have a lot of fun and laughs all while doing it either from the comfort of your couch OR in our cozy office and classroom space near the [place] airport. If you are taking a virtual class, then class is taught LIVE, so you can ask questions and receive answers in real-time from an experienced doula and childbirth educator.  This virtual childbirth education class will meet for five instruction times online.  Although we are based in [place], [place], this virtual class is open to anyone in the world. Please visit: [email or web address] for more information.  Books We Love: The Doula Guide to Childbirth by Harland German and Otila Back The First-Time Parent's Childbirth Handbook by Dr. Amie Critchley, CNM The Birth Partner by Truddie Crumble

## 2023-08-10 ENCOUNTER — Encounter: Payer: Self-pay | Admitting: Certified Nurse Midwife

## 2023-08-31 ENCOUNTER — Ambulatory Visit: Payer: Medicaid Other | Admitting: Obstetrics and Gynecology

## 2023-08-31 ENCOUNTER — Ambulatory Visit: Payer: Medicaid Other | Attending: Obstetrics and Gynecology

## 2023-08-31 ENCOUNTER — Other Ambulatory Visit: Payer: Self-pay | Admitting: *Deleted

## 2023-08-31 ENCOUNTER — Ambulatory Visit: Payer: Medicaid Other | Admitting: *Deleted

## 2023-08-31 VITALS — BP 127/78 | HR 105

## 2023-08-31 DIAGNOSIS — O99212 Obesity complicating pregnancy, second trimester: Secondary | ICD-10-CM | POA: Insufficient documentation

## 2023-08-31 DIAGNOSIS — O99810 Abnormal glucose complicating pregnancy: Secondary | ICD-10-CM | POA: Insufficient documentation

## 2023-08-31 DIAGNOSIS — O10012 Pre-existing essential hypertension complicating pregnancy, second trimester: Secondary | ICD-10-CM

## 2023-08-31 DIAGNOSIS — O0990 Supervision of high risk pregnancy, unspecified, unspecified trimester: Secondary | ICD-10-CM | POA: Insufficient documentation

## 2023-08-31 DIAGNOSIS — E6689 Other obesity not elsewhere classified: Secondary | ICD-10-CM | POA: Insufficient documentation

## 2023-08-31 DIAGNOSIS — Z3A19 19 weeks gestation of pregnancy: Secondary | ICD-10-CM

## 2023-08-31 DIAGNOSIS — O43192 Other malformation of placenta, second trimester: Secondary | ICD-10-CM

## 2023-08-31 DIAGNOSIS — E669 Obesity, unspecified: Secondary | ICD-10-CM

## 2023-08-31 DIAGNOSIS — O10912 Unspecified pre-existing hypertension complicating pregnancy, second trimester: Secondary | ICD-10-CM

## 2023-08-31 NOTE — Progress Notes (Signed)
  Maternal-Fetal Medicine Consultation Name: Kc Sedlak MRN: [id number]  G3 P2002 at 19w 2d gestation. Patient is here for fetal anatomy scan. On cell-free fetal DNA screening, the risks of aneuploidies are not increased.  She has chronic hypertension since her last delivery and takes nifedipine  XL 30 mg daily.  Her blood pressure today at our office is 127/78 mmHg. Her recent hemoglobin A1c was 5.7%. Obstetrical history significant for 2 term vaginal deliveries and both her children are in good health.  Her first pregnancy was complicated by gestational hypertension/preeclampsia (different partner). Ultrasound We performed fetal anatomy scan. No makers of aneuploidies or fetal structural defects are seen. Fetal biometry is consistent with her previously-established dates. Amniotic fluid is normal and good fetal activity is seen.  Marginal cord insertion is seen.   Patient understands the limitations of ultrasound in detecting fetal anomalies.  Our concerns include Chronic hypertension in pregnancy I counseled the patient on the possible adverse outcomes of poorly controlled chronic hypertension including maternal stroke, pulmonary edema, endorgan damage, placental abruption, and fetal growth restriction.  Superimposed preeclampsia occurs in 30% of pregnancies with chronic hypertension.  I discussed the benefit of low-dose aspirin  prophylaxis that delays or prevents preeclampsia.  Patient does not have contraindications to aspirin .  I encouraged her to take aspirin  from now till delivery.  I discussed our ultrasound protocol of serial fetal growth assessments and weekly antenatal testing from [redacted] weeks gestation till delivery.  Marginal cord insertion I explained the finding with help of ultrasound images and diagrams.  Marginal cord insertion is rarely associated with fetal growth restriction.  Patient will be having serial fetal growth assessments.  Recommendations - An appointment  was made for her to return in 5 weeks in 9 weeks for fetal growth assessments. - Weekly antenatal testing from [redacted] weeks gestation until delivery. -Low-dose aspirin  (81 milligrams daily) for now till delivery.     Consultation including face-to-face (more than 50%) counseling 20 minutes.

## 2023-08-31 NOTE — Progress Notes (Signed)
 Tyuiop[] +[id number]

## 2023-09-04 NOTE — Progress Notes (Unsigned)
   PRENATAL VISIT NOTE  Subjective:  Kimberly Farley is a 33 y.o. G3P2002 at [redacted]w[redacted]d being seen today for ongoing prenatal care.  She is currently monitored for the following issues for this {Blank single:19197::"high-risk","low-risk"} pregnancy and has Chronic hypertension; Prediabetes; Atypical squamous cell changes of undetermined significance (ASCUS) with negative high risk human papilloma virus (HPV) pap smear 03/20/21; and Supervision of high risk pregnancy, antepartum on their problem list.  Patient reports {sx:14538}.   .  .   . Denies leaking of fluid.   The following portions of the patient's history were reviewed and updated as appropriate: allergies, current medications, past family history, past medical history, past social history, past surgical history and problem list.   Objective:  There were no vitals filed for this visit.  Fetal Status:           General:  Alert, oriented and cooperative. Patient is in no acute distress.  Skin: Skin is warm and dry. No rash noted.   Cardiovascular: Normal heart rate noted  Respiratory: Normal respiratory effort, no problems with respiration noted  Abdomen: Soft, gravid, appropriate for gestational age.        Pelvic: {Blank single:19197::"Cervical exam performed in the presence of a chaperone","Cervical exam deferred"}        Extremities: Normal range of motion.     Mental Status: Normal mood and affect. Normal behavior. Normal judgment and thought content.   Assessment and Plan:  Pregnancy: G3P2002 at [redacted]w[redacted]d 1. Supervision of high risk pregnancy, antepartum (Primary) ***  2. [redacted] weeks gestation of pregnancy ***  3. Low TSH level ***  4. Unwanted fertility ***  5. Chronic hypertension affecting pregnancy ***  {Blank single:19197::"Term","Preterm"} labor symptoms and general obstetric precautions including but not limited to vaginal bleeding, contractions, leaking of fluid and fetal movement were reviewed in detail with the  patient. Please refer to After Visit Summary for other counseling recommendations.   No follow-ups on file.  Future Appointments  Date Time Provider Department Center  09/06/2023 10:55 AM Curlie Doughty [facility]  10/04/2023  9:35 AM Curlie Doughty [facility]  10/05/2023 11:00 AM [facility] PROVIDER 1 [facility]  10/05/2023 11:30 AM [facility] US4 [facility]  11/02/2023 11:00 AM [facility] PROVIDER 1 [facility]  11/02/2023 11:30 AM [facility] US4 [facility]    Salomon Cree, CNM

## 2023-09-06 ENCOUNTER — Ambulatory Visit (INDEPENDENT_AMBULATORY_CARE_PROVIDER_SITE_OTHER): Admitting: Certified Nurse Midwife

## 2023-09-06 ENCOUNTER — Other Ambulatory Visit: Payer: Self-pay

## 2023-09-06 VITALS — BP 146/83 | HR 108 | Wt 185.8 lb

## 2023-09-06 DIAGNOSIS — O0992 Supervision of high risk pregnancy, unspecified, second trimester: Secondary | ICD-10-CM

## 2023-09-06 DIAGNOSIS — O10919 Unspecified pre-existing hypertension complicating pregnancy, unspecified trimester: Secondary | ICD-10-CM

## 2023-09-06 DIAGNOSIS — R7989 Other specified abnormal findings of blood chemistry: Secondary | ICD-10-CM | POA: Diagnosis not present

## 2023-09-06 DIAGNOSIS — O0990 Supervision of high risk pregnancy, unspecified, unspecified trimester: Secondary | ICD-10-CM

## 2023-09-06 DIAGNOSIS — Z3009 Encounter for other general counseling and advice on contraception: Secondary | ICD-10-CM

## 2023-09-06 DIAGNOSIS — Z3A20 20 weeks gestation of pregnancy: Secondary | ICD-10-CM

## 2023-09-06 DIAGNOSIS — O10912 Unspecified pre-existing hypertension complicating pregnancy, second trimester: Secondary | ICD-10-CM

## 2023-09-06 DIAGNOSIS — Z3A19 19 weeks gestation of pregnancy: Secondary | ICD-10-CM | POA: Diagnosis not present

## 2023-09-06 MED ORDER — ASPIRIN 81 MG PO TBEC: 81.0000 mg | DELAYED_RELEASE_TABLET | Freq: Every day | ORAL | 2 refills | Status: DC

## 2023-09-08 ENCOUNTER — Encounter: Payer: Self-pay | Admitting: Certified Nurse Midwife

## 2023-09-08 LAB — AFP, SERUM, OPEN SPINA BIFIDA
AFP MoM: 1.28
AFP Value: 69.8 ng/mL
Gest. Age on Collection Date: 20 wk
Maternal Age At EDD: 30.9 a
OSBR Risk 1 IN: 10000
Test Results:: NEGATIVE
Weight: 186 [lb_av]

## 2023-09-08 LAB — THYROID PANEL WITH TSH
Free Thyroxine Index: 1.2 (ref 1.2–4.9)
T3 Uptake Ratio: 13 % — ABNORMAL LOW (ref 24–39)
T4, Total: 9.6 ug/dL (ref 4.5–12.0)
TSH: 1.32 u[IU]/mL (ref 0.450–4.500)

## 2023-10-04 ENCOUNTER — Encounter: Payer: Self-pay | Admitting: Certified Nurse Midwife

## 2023-10-05 ENCOUNTER — Ambulatory Visit: Admitting: Obstetrics

## 2023-10-05 ENCOUNTER — Other Ambulatory Visit: Payer: Self-pay | Admitting: *Deleted

## 2023-10-05 ENCOUNTER — Ambulatory Visit: Attending: Obstetrics and Gynecology

## 2023-10-05 VITALS — BP 137/75 | HR 104

## 2023-10-05 DIAGNOSIS — O10912 Unspecified pre-existing hypertension complicating pregnancy, second trimester: Secondary | ICD-10-CM | POA: Insufficient documentation

## 2023-10-05 DIAGNOSIS — E669 Obesity, unspecified: Secondary | ICD-10-CM

## 2023-10-05 DIAGNOSIS — O99212 Obesity complicating pregnancy, second trimester: Secondary | ICD-10-CM

## 2023-10-05 DIAGNOSIS — O0990 Supervision of high risk pregnancy, unspecified, unspecified trimester: Secondary | ICD-10-CM | POA: Insufficient documentation

## 2023-10-05 DIAGNOSIS — O10012 Pre-existing essential hypertension complicating pregnancy, second trimester: Secondary | ICD-10-CM

## 2023-10-05 DIAGNOSIS — I10 Essential (primary) hypertension: Secondary | ICD-10-CM

## 2023-10-05 DIAGNOSIS — Z3A24 24 weeks gestation of pregnancy: Secondary | ICD-10-CM | POA: Diagnosis present

## 2023-10-05 DIAGNOSIS — O43102 Malformation of placenta, unspecified, second trimester: Secondary | ICD-10-CM | POA: Diagnosis not present

## 2023-10-05 DIAGNOSIS — O99810 Abnormal glucose complicating pregnancy: Secondary | ICD-10-CM

## 2023-10-05 NOTE — Progress Notes (Signed)
 MFM Consult Note  Patrisha Hausmann is currently at 24 weeks and 2 days.  She has been followed due to chronic hypertension treated with nifedipine .  She denies any problems since her last exam.  Her blood pressure today was 137/75.  On today's exam, the overall EFW of 1 pound 7 ounces measures at the 31st percentile for her gestational age.  There was normal amniotic fluid noted .   Due to chronic hypertension treated with nifedipine , we will continue to follow her with monthly growth scans.    Weekly fetal testing will be started at around 32 weeks.    The increased risk of superimposed preeclampsia was discussed.  She was advised to continue taking a daily baby aspirin  for preeclampsia prophylaxis.  A follow-up growth scan was scheduled in 4 weeks.    The patient stated that all of her questions were answered today.  A total of 20 minutes was spent counseling and coordinating the care for this patient.  Greater than 50% of the time was spent in direct face-to-face contact.

## 2023-10-06 ENCOUNTER — Encounter: Admitting: Obstetrics and Gynecology

## 2023-10-10 ENCOUNTER — Ambulatory Visit: Admitting: Obstetrics and Gynecology

## 2023-10-10 ENCOUNTER — Other Ambulatory Visit: Payer: Self-pay

## 2023-10-10 VITALS — BP 120/80 | HR 118 | Wt 189.8 lb

## 2023-10-10 DIAGNOSIS — Z3A25 25 weeks gestation of pregnancy: Secondary | ICD-10-CM | POA: Diagnosis not present

## 2023-10-10 DIAGNOSIS — O0992 Supervision of high risk pregnancy, unspecified, second trimester: Secondary | ICD-10-CM | POA: Diagnosis not present

## 2023-10-10 DIAGNOSIS — O0990 Supervision of high risk pregnancy, unspecified, unspecified trimester: Secondary | ICD-10-CM

## 2023-10-10 DIAGNOSIS — I10 Essential (primary) hypertension: Secondary | ICD-10-CM

## 2023-10-10 DIAGNOSIS — R7303 Prediabetes: Secondary | ICD-10-CM

## 2023-10-10 DIAGNOSIS — O10012 Pre-existing essential hypertension complicating pregnancy, second trimester: Secondary | ICD-10-CM

## 2023-10-10 NOTE — Progress Notes (Signed)
   PRENATAL VISIT NOTE  Subjective:  Kimberly Farley is a 33 y.o. G3P2002 at [redacted]w[redacted]d being seen today for ongoing prenatal care.  She is currently monitored for the following issues for this high-risk pregnancy and has Chronic hypertension; Prediabetes; Atypical squamous cell changes of undetermined significance (ASCUS) with negative high risk human papilloma virus (HPV) pap smear 03/20/21; and Supervision of high risk pregnancy, antepartum on their problem list.  Patient reports started school, asking about  handicap sticker. Contractions: Not present. Vag. Bleeding: None.  Movement: Present. Denies leaking of fluid.   The following portions of the patient's history were reviewed and updated as appropriate: allergies, current medications, past family history, past medical history, past social history, past surgical history and problem list.   Objective:    Vitals:   10/10/23 0836  BP: 120/80  Pulse: (!) 118  Weight: 189 lb 12.8 oz (86.1 kg)    Fetal Status:  Fetal Heart Rate (bpm): 154   Movement: Present    General: Alert, oriented and cooperative. Patient is in no acute distress.  Skin: Skin is warm and dry. No rash noted.   Cardiovascular: Normal heart rate noted  Respiratory: Normal respiratory effort, no problems with respiration noted  Abdomen: Soft, gravid, appropriate for gestational age.  Pain/Pressure: Present     Pelvic: Cervical exam deferred        Extremities: Normal range of motion.  Edema: None  Mental Status: Normal mood and affect. Normal behavior. Normal judgment and thought content.   Assessment and Plan:  Pregnancy: G3P2002 at [redacted]w[redacted]d 1. Supervision of high risk pregnancy, antepartum (Primary) BP and FHR normal Doing well, feeling regular movement    2. [redacted] weeks gestation of pregnancy Discussed handicap sticker inquiry with DMV   3. Chronic hypertension On nifedipine  30mg  once daily , normotensive today Continue ASA 5/28 u/s EFW 31%, normal  afi Continue q4wk growth, antenatal testing start at 32 weeks  4. Prediabetes Normal early 2hr, Anticipatory guidance regarding GTT and labs next visit, discussed NPO status after midnight    Preterm labor symptoms and general obstetric precautions including but not limited to vaginal bleeding, contractions, leaking of fluid and fetal movement were reviewed in detail with the patient. Please refer to After Visit Summary for other counseling recommendations.   Return in about 3 weeks (around 10/31/2023) for OB VISIT (MD or APP), 2 hr GTT.   Susi Eric, FNP

## 2023-10-27 ENCOUNTER — Other Ambulatory Visit: Payer: Self-pay

## 2023-10-27 DIAGNOSIS — O0990 Supervision of high risk pregnancy, unspecified, unspecified trimester: Secondary | ICD-10-CM

## 2023-11-02 ENCOUNTER — Ambulatory Visit

## 2023-11-02 ENCOUNTER — Other Ambulatory Visit: Payer: Self-pay

## 2023-11-02 ENCOUNTER — Other Ambulatory Visit

## 2023-11-02 DIAGNOSIS — O0990 Supervision of high risk pregnancy, unspecified, unspecified trimester: Secondary | ICD-10-CM

## 2023-11-03 ENCOUNTER — Ambulatory Visit: Attending: Obstetrics and Gynecology

## 2023-11-03 ENCOUNTER — Ambulatory Visit: Payer: Self-pay | Admitting: Obstetrics and Gynecology

## 2023-11-03 ENCOUNTER — Ambulatory Visit (HOSPITAL_BASED_OUTPATIENT_CLINIC_OR_DEPARTMENT_OTHER): Admitting: Maternal & Fetal Medicine

## 2023-11-03 VITALS — BP 127/75 | HR 95

## 2023-11-03 DIAGNOSIS — O10013 Pre-existing essential hypertension complicating pregnancy, third trimester: Secondary | ICD-10-CM | POA: Diagnosis not present

## 2023-11-03 DIAGNOSIS — Z3A28 28 weeks gestation of pregnancy: Secondary | ICD-10-CM | POA: Diagnosis present

## 2023-11-03 DIAGNOSIS — I10 Essential (primary) hypertension: Secondary | ICD-10-CM

## 2023-11-03 DIAGNOSIS — R7303 Prediabetes: Secondary | ICD-10-CM | POA: Diagnosis not present

## 2023-11-03 DIAGNOSIS — O10912 Unspecified pre-existing hypertension complicating pregnancy, second trimester: Secondary | ICD-10-CM | POA: Diagnosis present

## 2023-11-03 DIAGNOSIS — Z3A29 29 weeks gestation of pregnancy: Secondary | ICD-10-CM | POA: Diagnosis not present

## 2023-11-03 DIAGNOSIS — O0990 Supervision of high risk pregnancy, unspecified, unspecified trimester: Secondary | ICD-10-CM | POA: Diagnosis present

## 2023-11-03 LAB — T3: T3, Total: 222 ng/dL — ABNORMAL HIGH (ref 71–180)

## 2023-11-03 LAB — CBC
Hematocrit: 40.2 % (ref 34.0–46.6)
Hemoglobin: 13.9 g/dL (ref 11.1–15.9)
MCH: 34.3 pg — ABNORMAL HIGH (ref 26.6–33.0)
MCHC: 34.6 g/dL (ref 31.5–35.7)
MCV: 99 fL — ABNORMAL HIGH (ref 79–97)
Platelets: 289 10*3/uL (ref 150–450)
RBC: 4.05 x10E6/uL (ref 3.77–5.28)
RDW: 13.2 % (ref 11.7–15.4)
WBC: 11 10*3/uL — ABNORMAL HIGH (ref 3.4–10.8)

## 2023-11-03 LAB — HIV ANTIBODY (ROUTINE TESTING W REFLEX): HIV Screen 4th Generation wRfx: NONREACTIVE

## 2023-11-03 LAB — TSH RFX ON ABNORMAL TO FREE T4: TSH: 1.2 u[IU]/mL (ref 0.450–4.500)

## 2023-11-03 LAB — GLUCOSE TOLERANCE, 2 HOURS W/ 1HR
Glucose, 1 hour: 122 mg/dL (ref 70–179)
Glucose, 2 hour: 110 mg/dL (ref 70–152)
Glucose, Fasting: 71 mg/dL (ref 70–91)

## 2023-11-03 LAB — RPR: RPR Ser Ql: NONREACTIVE

## 2023-11-04 ENCOUNTER — Encounter: Payer: Self-pay | Admitting: Obstetrics and Gynecology

## 2023-11-04 ENCOUNTER — Other Ambulatory Visit

## 2023-11-07 ENCOUNTER — Ambulatory Visit: Payer: Self-pay | Admitting: Obstetrics and Gynecology

## 2023-11-07 DIAGNOSIS — O0990 Supervision of high risk pregnancy, unspecified, unspecified trimester: Secondary | ICD-10-CM

## 2023-11-07 NOTE — Progress Notes (Unsigned)
   PRENATAL VISIT NOTE  Subjective:  Kimberly Farley is a 33 y.o. G3P2002 at [redacted]w[redacted]d being seen today for ongoing prenatal care.  She is currently monitored for the following issues for this {Blank single:19197::high-risk,low-risk} pregnancy and has Chronic hypertension; Prediabetes; Atypical squamous cell changes of undetermined significance (ASCUS) with negative high risk human papilloma virus (HPV) pap smear 03/20/21; and Supervision of high risk pregnancy, antepartum on their problem list.  Patient reports {sx:14538}.   .  .   . Denies leaking of fluid.   The following portions of the patient's history were reviewed and updated as appropriate: allergies, current medications, past family history, past medical history, past social history, past surgical history and problem list.   Objective:    There were no vitals filed for this visit.  Fetal Status:           General: Alert, oriented and cooperative. Patient is in no acute distress.  Skin: Skin is warm and dry. No rash noted.   Cardiovascular: Normal heart rate noted  Respiratory: Normal respiratory effort, no problems with respiration noted  Abdomen: Soft, gravid, appropriate for gestational age.        Pelvic: {Blank single:19197::Cervical exam performed in the presence of a chaperone,Cervical exam deferred}        Extremities: Normal range of motion.     Mental Status: Normal mood and affect. Normal behavior. Normal judgment and thought content.   Assessment and Plan:  Pregnancy: G3P2002 at [redacted]w[redacted]d 1. Supervision of high risk pregnancy, antepartum (Primary) ***  2. [redacted] weeks gestation of pregnancy ***  3. Unwanted fertility - BTL papers  4. Chronic hypertension affecting pregnancy ***  5. Prediabetes - Normal GTT  {Blank single:19197::Term,Preterm} labor symptoms and general obstetric precautions including but not limited to vaginal bleeding, contractions, leaking of fluid and fetal movement were reviewed  in detail with the patient. Please refer to After Visit Summary for other counseling recommendations.   Return in about 2 weeks (around 11/22/2023) for HROB, CNM if possible.  Future Appointments  Date Time Provider Department Center  11/08/2023  1:15 PM Regino Camie DELENA EDDY [facility]  12/01/2023 11:00 AM [facility] PROVIDER 1 [facility]  12/01/2023 11:30 AM [facility] US4 [facility]  12/08/2023 11:00 AM [facility] PROVIDER 1 [facility]  12/08/2023 11:30 AM [facility] US4 [facility]  12/15/2023 11:00 AM [facility] PROVIDER 1 [facility]  12/15/2023 11:30 AM [facility] US4 [facility]    Camie DELENA Regino, CNM

## 2023-11-08 ENCOUNTER — Other Ambulatory Visit: Payer: Self-pay

## 2023-11-08 ENCOUNTER — Ambulatory Visit: Admitting: Certified Nurse Midwife

## 2023-11-08 VITALS — BP 122/84 | HR 120 | Wt 193.0 lb

## 2023-11-08 DIAGNOSIS — O10919 Unspecified pre-existing hypertension complicating pregnancy, unspecified trimester: Secondary | ICD-10-CM

## 2023-11-08 DIAGNOSIS — Z3009 Encounter for other general counseling and advice on contraception: Secondary | ICD-10-CM

## 2023-11-08 DIAGNOSIS — R7303 Prediabetes: Secondary | ICD-10-CM | POA: Diagnosis not present

## 2023-11-08 DIAGNOSIS — Z3A29 29 weeks gestation of pregnancy: Secondary | ICD-10-CM | POA: Diagnosis not present

## 2023-11-08 DIAGNOSIS — O10013 Pre-existing essential hypertension complicating pregnancy, third trimester: Secondary | ICD-10-CM | POA: Diagnosis not present

## 2023-11-08 DIAGNOSIS — O0990 Supervision of high risk pregnancy, unspecified, unspecified trimester: Secondary | ICD-10-CM

## 2023-11-08 NOTE — Patient Instructions (Addendum)
 For sleep aid in pregnancy: -Magnesium glycinate or oxide, 400mg  by mouth, once daily at bedtime -Vitamin B2, 400mg  by mouth, once daily in  morning - Unisom (doxylamine) 12.5 - 25mg  at bedtime

## 2023-11-10 ENCOUNTER — Encounter: Payer: Self-pay | Admitting: *Deleted

## 2023-11-14 NOTE — Progress Notes (Signed)
 After review, MFM consult with provider is not indicated for today  Kimberly Nathanel Pipe, MD 11/14/2023 11:16 AM  [facility]

## 2023-12-01 ENCOUNTER — Telehealth: Payer: Self-pay

## 2023-12-01 ENCOUNTER — Ambulatory Visit

## 2023-12-01 DIAGNOSIS — O0990 Supervision of high risk pregnancy, unspecified, unspecified trimester: Secondary | ICD-10-CM

## 2023-12-02 ENCOUNTER — Encounter: Admitting: Family Medicine

## 2023-12-08 ENCOUNTER — Other Ambulatory Visit: Payer: Self-pay | Admitting: *Deleted

## 2023-12-08 ENCOUNTER — Ambulatory Visit: Attending: Obstetrics

## 2023-12-08 ENCOUNTER — Ambulatory Visit: Admitting: Maternal & Fetal Medicine

## 2023-12-08 VITALS — BP 119/71

## 2023-12-08 DIAGNOSIS — I10 Essential (primary) hypertension: Secondary | ICD-10-CM | POA: Diagnosis present

## 2023-12-08 DIAGNOSIS — O0990 Supervision of high risk pregnancy, unspecified, unspecified trimester: Secondary | ICD-10-CM | POA: Insufficient documentation

## 2023-12-08 DIAGNOSIS — O99212 Obesity complicating pregnancy, second trimester: Secondary | ICD-10-CM | POA: Diagnosis present

## 2023-12-08 DIAGNOSIS — O10013 Pre-existing essential hypertension complicating pregnancy, third trimester: Secondary | ICD-10-CM

## 2023-12-08 DIAGNOSIS — O99810 Abnormal glucose complicating pregnancy: Secondary | ICD-10-CM | POA: Diagnosis not present

## 2023-12-08 DIAGNOSIS — Z3A33 33 weeks gestation of pregnancy: Secondary | ICD-10-CM

## 2023-12-08 DIAGNOSIS — E669 Obesity, unspecified: Secondary | ICD-10-CM

## 2023-12-08 DIAGNOSIS — O43193 Other malformation of placenta, third trimester: Secondary | ICD-10-CM

## 2023-12-08 DIAGNOSIS — O99213 Obesity complicating pregnancy, third trimester: Secondary | ICD-10-CM | POA: Diagnosis not present

## 2023-12-08 DIAGNOSIS — R7303 Prediabetes: Secondary | ICD-10-CM

## 2023-12-08 NOTE — Progress Notes (Signed)
   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 G3P2002 at [redacted]w[redacted]d here for ultrasound and consultation. Kimberly Farley is doing well today with no acute concerns. Today we focused on the following:   CHTN: Well controlled with Nifedipine .  Discussed the importance of monitoring her blood pressure at home.  I discussed the fetal growth is within normal limits.  She will come back weekly for nonstress tests and then growth in 4 weeks with a BPP.  The patient had time to ask questions that were answered to her satisfaction.  She verbalized understanding and agrees to proceed with the plan below.  Sonographic findings Single intrauterine pregnancy. Fetal cardiac activity: Observed. Presentation: Cephalic. Interval fetal anatomy appears normal. Fetal biometry shows the estimated fetal weight at the 26 percentile. Amniotic fluid: Within normal limits.  MVP: 5.34 cm. Placenta: Posterior. BPP: 8/8.   There are limitations of prenatal ultrasound such as the inability to detect certain abnormalities due to poor visualization. Various factors such as fetal position, gestational age and maternal body habitus may increase the difficulty in visualizing the fetal anatomy.    Recommendations -Serial growth ultrasounds every 4 weeks until delivery - Continue weekly antenatal testing -Delivery around 38 or sooner if indicated-[redacted] weeks gestation  Review of Systems: A review  of systems was performed and was negative except per HPI   Vitals and Physical Exam    12/08/2023   11:02 AM 11/08/2023    1:32 PM 11/03/2023   11:19 AM  Vitals with BMI  Weight  193 lbs   Systolic 119 122 872  Diastolic 71 84 75  Pulse  120 95    Sitting comfortably on the sonogram table Nonlabored breathing Normal rate and rhythm Abdomen is nontender  Past pregnancies OB History  Gravida Para Term Preterm AB Living  3 2 2  0 0 2  SAB IAB Ectopic Multiple Live Births  0 0 0 0 2    # Outcome Date GA Lbr Len/2nd Weight Sex Type Anes PTL Lv  3 Current           2 Term 09/16/21 [redacted]w[redacted]d  6 lb 14.8 oz (3.14 kg) F Vag-Spont None  LIV  1 Term 01/05/16 [redacted]w[redacted]d 10:33 / 00:53 5 lb 5.9 oz (2.435 kg) M Vag-Spont Other  LIV     I spent 20 minutes reviewing the patients chart, including labs and images as well as counseling the patient about her medical conditions. Greater than 50% of the time was spent in direct face-to-face patient counseling.  Kimberly Farley  MFM, [facility]   12/08/2023  11:46 AM

## 2023-12-09 ENCOUNTER — Ambulatory Visit: Admitting: Obstetrics and Gynecology

## 2023-12-09 ENCOUNTER — Other Ambulatory Visit: Payer: Self-pay

## 2023-12-09 VITALS — BP 122/81 | HR 104 | Wt 197.0 lb

## 2023-12-09 DIAGNOSIS — O0993 Supervision of high risk pregnancy, unspecified, third trimester: Secondary | ICD-10-CM | POA: Diagnosis not present

## 2023-12-09 DIAGNOSIS — O10919 Unspecified pre-existing hypertension complicating pregnancy, unspecified trimester: Secondary | ICD-10-CM

## 2023-12-09 DIAGNOSIS — Z3009 Encounter for other general counseling and advice on contraception: Secondary | ICD-10-CM | POA: Diagnosis not present

## 2023-12-09 DIAGNOSIS — O10913 Unspecified pre-existing hypertension complicating pregnancy, third trimester: Secondary | ICD-10-CM | POA: Diagnosis not present

## 2023-12-09 DIAGNOSIS — Z3A33 33 weeks gestation of pregnancy: Secondary | ICD-10-CM

## 2023-12-09 DIAGNOSIS — O0990 Supervision of high risk pregnancy, unspecified, unspecified trimester: Secondary | ICD-10-CM

## 2023-12-09 NOTE — Progress Notes (Signed)
   PRENATAL VISIT NOTE  Subjective:  Kimberly Farley is a 33 y.o. G3P2002 at [redacted]w[redacted]d being seen today for ongoing prenatal care.  She is currently monitored for the following issues for this high-risk pregnancy and has Chronic hypertension; Prediabetes; Atypical squamous cell changes of undetermined significance (ASCUS) with negative high risk human papilloma virus (HPV) pap smear 03/20/21; and Supervision of high risk pregnancy, antepartum on their problem list.  Patient reports intermittent pelvic pressure.  Contractions: Irritability. Vag. Bleeding: None.  Movement: Present. Denies leaking of fluid.   The following portions of the patient's history were reviewed and updated as appropriate: allergies, current medications, past family history, past medical history, past social history, past surgical history and problem list.   Objective:    Vitals:   12/09/23 1056  BP: 122/81  Pulse: (!) 104  Weight: 197 lb (89.4 kg)    Fetal Status:  Fetal Heart Rate (bpm): 142   Movement: Present    General: Alert, oriented and cooperative. Patient is in no acute distress.  Skin: Skin is warm and dry. No rash noted.   Cardiovascular: Normal heart rate noted  Respiratory: Normal respiratory effort, no problems with respiration noted  Abdomen: Soft, gravid, appropriate for gestational age.  Pain/Pressure: Present (pelvic pressure)     Pelvic: Cervical exam deferred        Extremities: Normal range of motion.  Edema: None  Mental Status: Normal mood and affect. Normal behavior. Normal judgment and thought content.   Assessment and Plan:  Pregnancy: G3P2002 at [redacted]w[redacted]d 1. Supervision of high risk pregnancy, antepartum (Primary) BP and FHR normal Doing well, feeling regular movement    2. [redacted] weeks gestation of pregnancy Precautions regarding pressure and cramping, maternity belt provided today  Decline tdap Defer thyroid  labs today, will come back for lab visit   3. Chronic hypertension  affecting pregnancy On procardia   7/31 u/s afi normal, BPP 8/8, efw 26% Continue serial growtha and antenatal testing   4. Unwanted fertility BTL previously signed   Preterm labor symptoms and general obstetric precautions including but not limited to vaginal bleeding, contractions, leaking of fluid and fetal movement were reviewed in detail with the patient. Please refer to After Visit Summary for other counseling recommendations.   Return lab visit 8/7 (if available), then 2 weeks for ob visit.  Future Appointments  Date Time Provider Department Center  12/15/2023 10:45 AM [facility] NST [facility]  12/15/2023 11:00 AM [facility] PROVIDER 1 [facility]  12/15/2023 11:30 AM [facility] US4 [facility]  12/22/2023 10:45 AM [facility] NST [facility]  12/29/2023  3:15 PM [facility] NST [facility]  01/05/2024 11:15 AM [facility] PROVIDER 1 [facility]  01/05/2024 11:30 AM [facility] US1 [facility]    Nidia Daring, FNP

## 2023-12-15 ENCOUNTER — Other Ambulatory Visit

## 2023-12-15 ENCOUNTER — Ambulatory Visit

## 2023-12-15 ENCOUNTER — Other Ambulatory Visit: Payer: Self-pay | Admitting: Obstetrics

## 2023-12-15 ENCOUNTER — Other Ambulatory Visit: Payer: Self-pay

## 2023-12-15 ENCOUNTER — Ambulatory Visit: Admitting: Obstetrics

## 2023-12-15 ENCOUNTER — Ambulatory Visit: Attending: Obstetrics

## 2023-12-15 VITALS — BP 139/77 | HR 100

## 2023-12-15 DIAGNOSIS — O99212 Obesity complicating pregnancy, second trimester: Secondary | ICD-10-CM | POA: Diagnosis present

## 2023-12-15 DIAGNOSIS — O99810 Abnormal glucose complicating pregnancy: Secondary | ICD-10-CM | POA: Diagnosis not present

## 2023-12-15 DIAGNOSIS — O0990 Supervision of high risk pregnancy, unspecified, unspecified trimester: Secondary | ICD-10-CM

## 2023-12-15 DIAGNOSIS — E669 Obesity, unspecified: Secondary | ICD-10-CM

## 2023-12-15 DIAGNOSIS — O99213 Obesity complicating pregnancy, third trimester: Secondary | ICD-10-CM | POA: Insufficient documentation

## 2023-12-15 DIAGNOSIS — I10 Essential (primary) hypertension: Secondary | ICD-10-CM | POA: Diagnosis present

## 2023-12-15 DIAGNOSIS — O10913 Unspecified pre-existing hypertension complicating pregnancy, third trimester: Secondary | ICD-10-CM | POA: Insufficient documentation

## 2023-12-15 DIAGNOSIS — R7303 Prediabetes: Secondary | ICD-10-CM | POA: Insufficient documentation

## 2023-12-15 DIAGNOSIS — O10013 Pre-existing essential hypertension complicating pregnancy, third trimester: Secondary | ICD-10-CM

## 2023-12-15 DIAGNOSIS — O10012 Pre-existing essential hypertension complicating pregnancy, second trimester: Secondary | ICD-10-CM | POA: Diagnosis not present

## 2023-12-15 DIAGNOSIS — Z3A34 34 weeks gestation of pregnancy: Secondary | ICD-10-CM

## 2023-12-15 DIAGNOSIS — O43192 Other malformation of placenta, second trimester: Secondary | ICD-10-CM

## 2023-12-15 NOTE — Procedures (Signed)
 Kimberly Farley December 29, 1990 [redacted]w[redacted]d  Fetus A Non-Stress Test Interpretation for 12/15/23  Indication: Chronic Hypertenstion  Fetal Heart Rate A Mode: External Baseline Rate (A): 135 bpm Variability: Moderate Accelerations: 15 x 15 Scalp Stimulation: Positive  Uterine Activity Mode: Palpation, Toco Contraction Frequency (min): none noted Resting Tone Palpated: Relaxed  Interpretation (Fetal Testing) Nonstress Test Interpretation: Reactive Comments: Reviewed with Dr. Ileana

## 2023-12-15 NOTE — Progress Notes (Signed)
 MFM Consult Note  Kimberly Farley is currently at 34 weeks and 3 days.  She was seen for fetal testing due to chronic hypertension treated with nifedipine  30 mg daily.    She denies any problems since her last exam.  Her blood pressure today was 139/77.  A biophysical profile performed today was 10/10 with a reactive NST.    There was normal amniotic fluid noted with a total AFI of 15.38 cm.  Due to chronic hypertension, we will continue to follow her with weekly fetal testing until delivery.    Delivery should probably occur by 39 weeks as long as her blood pressures continue to be within normal limits and she does not develop preeclampsia.    She will return in 1 week for another BPP.    The patient stated that all of her questions were answered today.  A total of 20 minutes was spent counseling and coordinating the care for this patient.  Greater than 50% of the time was spent in direct face-to-face contact.

## 2023-12-19 ENCOUNTER — Other Ambulatory Visit

## 2023-12-22 ENCOUNTER — Ambulatory Visit: Attending: Maternal & Fetal Medicine

## 2023-12-22 DIAGNOSIS — Z3A35 35 weeks gestation of pregnancy: Secondary | ICD-10-CM | POA: Insufficient documentation

## 2023-12-22 DIAGNOSIS — I10 Essential (primary) hypertension: Secondary | ICD-10-CM | POA: Diagnosis not present

## 2023-12-22 DIAGNOSIS — O10913 Unspecified pre-existing hypertension complicating pregnancy, third trimester: Secondary | ICD-10-CM | POA: Insufficient documentation

## 2023-12-22 NOTE — Procedures (Signed)
 Kimberly Farley Apr 28, 1991 [redacted]w[redacted]d  Fetus A Non-Stress Test Interpretation for 12/22/23  Indication: Chronic Hypertenstion - NST only  Fetal Heart Rate A Mode: External Baseline Rate (A): 130 bpm Variability: Moderate Accelerations: 15 x 15 Decelerations: None Multiple birth?: No  Uterine Activity Mode: Toco, Palpation Contraction Frequency (min): none noted Resting Tone Palpated: Relaxed  Interpretation (Fetal Testing) Nonstress Test Interpretation: Reactive Comments: Reviewed with Dr. Arna

## 2023-12-23 ENCOUNTER — Ambulatory Visit (INDEPENDENT_AMBULATORY_CARE_PROVIDER_SITE_OTHER): Admitting: Family Medicine

## 2023-12-23 ENCOUNTER — Other Ambulatory Visit: Payer: Self-pay

## 2023-12-23 VITALS — BP 116/80 | HR 106 | Wt 198.0 lb

## 2023-12-23 DIAGNOSIS — O0990 Supervision of high risk pregnancy, unspecified, unspecified trimester: Secondary | ICD-10-CM

## 2023-12-23 DIAGNOSIS — O0993 Supervision of high risk pregnancy, unspecified, third trimester: Secondary | ICD-10-CM | POA: Diagnosis not present

## 2023-12-23 DIAGNOSIS — R7303 Prediabetes: Secondary | ICD-10-CM | POA: Diagnosis not present

## 2023-12-23 DIAGNOSIS — Z3A35 35 weeks gestation of pregnancy: Secondary | ICD-10-CM

## 2023-12-23 DIAGNOSIS — I10 Essential (primary) hypertension: Secondary | ICD-10-CM

## 2023-12-23 NOTE — Progress Notes (Signed)
   PRENATAL VISIT NOTE  Subjective:  Kimberly Farley is a 33 y.o. G3P2002 at [redacted]w[redacted]d being seen today for ongoing prenatal care.  She is currently monitored for the following issues for this low-risk pregnancy and has Chronic hypertension; Prediabetes; Atypical squamous cell changes of undetermined significance (ASCUS) with negative high risk human papilloma virus (HPV) pap smear 03/20/21; and Supervision of high risk pregnancy, antepartum on their problem list.  Patient reports no complaints.  Contractions: Irritability. Vag. Bleeding: None.  Movement: Present. Denies leaking of fluid.   The following portions of the patient's history were reviewed and updated as appropriate: allergies, current medications, past family history, past medical history, past social history, past surgical history and problem list.   Objective:    Vitals:   12/23/23 1043  BP: 116/80  Pulse: (!) 106  Weight: 198 lb (89.8 kg)    Fetal Status:  Fetal Heart Rate (bpm): 145   Movement: Present    General: Alert, oriented and cooperative. Patient is in no acute distress.  Skin: Skin is warm and dry. No rash noted.   Cardiovascular: Normal heart rate noted  Respiratory: Normal respiratory effort, no problems with respiration noted  Abdomen: Soft, gravid, appropriate for gestational age.  Pain/Pressure: Present     Pelvic: Cervical exam deferred        Extremities: Normal range of motion.  Edema: None  Mental Status: Normal mood and affect. Normal behavior. Normal judgment and thought content.   Assessment and Plan:  Pregnancy: G3P2002 at [redacted]w[redacted]d 1. 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 She was asking about short term disability. Reviewed general process with contacting HR and starting claim. Reviewed also timing of delivery (9/8-9/10-- for scheduled IOL).   Once she decides on IOL date this can be the start date of medical leave for ST Disability paperwork.   2. Chronic hypertension BP WNL On procardia  Weekly ante testing, last was on 8/7 -8 of 8 IOL at 39 weeks-- discussed 9/8-9/10 as target time and midnight vs day. She will discuss with spouse and come to next visit wi  3. Prediabetes WNL GTT was WNL  Preterm labor symptoms and general obstetric precautions including but not limited to vaginal bleeding, contractions, leaking of fluid and fetal movement were reviewed in detail with the patient. Please refer to After Visit Summary for other counseling recommendations.   Return in about 1 week (around 12/30/2023) for Routine prenatal care.  Future Appointments  Date Time Provider Department Center  12/26/2023 10:40 AM [facility] LAB [facility]  12/28/2023 10:35 AM Vannie Cornell JONELLE EDDY [facility]  12/29/2023  3:15 PM [facility] NST [facility]  01/05/2024 11:15 AM [facility] PROVIDER 1 [facility]  01/05/2024 11:30 AM [facility] US1 [facility]  01/27/2024  9:35 AM Lola, Donnice HERO, MD [facility]    Suzen Maryan Masters, MD

## 2023-12-26 ENCOUNTER — Other Ambulatory Visit

## 2023-12-26 ENCOUNTER — Other Ambulatory Visit: Payer: Self-pay

## 2023-12-26 DIAGNOSIS — O0990 Supervision of high risk pregnancy, unspecified, unspecified trimester: Secondary | ICD-10-CM

## 2023-12-27 DIAGNOSIS — R7989 Other specified abnormal findings of blood chemistry: Secondary | ICD-10-CM | POA: Insufficient documentation

## 2023-12-27 LAB — T3, FREE: T3, Free: 2.7 pg/mL (ref 2.0–4.4)

## 2023-12-27 LAB — TSH RFX ON ABNORMAL TO FREE T4: TSH: 1.11 u[IU]/mL (ref 0.450–4.500)

## 2023-12-28 ENCOUNTER — Ambulatory Visit (INDEPENDENT_AMBULATORY_CARE_PROVIDER_SITE_OTHER): Admitting: Certified Nurse Midwife

## 2023-12-28 ENCOUNTER — Other Ambulatory Visit: Payer: Self-pay

## 2023-12-28 ENCOUNTER — Telehealth: Payer: Self-pay

## 2023-12-28 ENCOUNTER — Other Ambulatory Visit (HOSPITAL_COMMUNITY)
Admission: RE | Admit: 2023-12-28 | Discharge: 2023-12-28 | Disposition: A | Discharge status: Home / Self Care | Source: Ambulatory Visit | Attending: Certified Nurse Midwife | Admitting: Certified Nurse Midwife

## 2023-12-28 ENCOUNTER — Ambulatory Visit: Payer: Self-pay | Admitting: Family Medicine

## 2023-12-28 VITALS — BP 130/89 | HR 124 | Wt 198.0 lb

## 2023-12-28 DIAGNOSIS — O0993 Supervision of high risk pregnancy, unspecified, third trimester: Secondary | ICD-10-CM

## 2023-12-28 DIAGNOSIS — I10 Essential (primary) hypertension: Secondary | ICD-10-CM | POA: Diagnosis not present

## 2023-12-28 DIAGNOSIS — Z3A36 36 weeks gestation of pregnancy: Secondary | ICD-10-CM

## 2023-12-28 DIAGNOSIS — R7989 Other specified abnormal findings of blood chemistry: Secondary | ICD-10-CM | POA: Diagnosis not present

## 2023-12-28 DIAGNOSIS — E0590 Thyrotoxicosis, unspecified without thyrotoxic crisis or storm: Secondary | ICD-10-CM

## 2023-12-28 NOTE — Progress Notes (Signed)
   PRENATAL VISIT NOTE  Subjective:  Kimberly Farley is a 33 y.o. G3P2002 at [redacted]w[redacted]d being seen today for ongoing prenatal care.  She is currently monitored for the following issues for this high-risk pregnancy and has Chronic hypertension; Prediabetes; Atypical squamous cell changes of undetermined significance (ASCUS) with negative high risk human papilloma virus (HPV) pap smear 03/20/21; Supervision of high-risk pregnancy; and Decreased thyroid  stimulating hormone (TSH) level on their problem list.  Patient reports no complaints.  Contractions: Not present. Vag. Bleeding: None.  Movement: Present. Denies leaking of fluid.   The following portions of the patient's history were reviewed and updated as appropriate: allergies, current medications, past family history, past medical history, past social history, past surgical history and problem list.   Objective:    Vitals:   12/28/23 1051  BP: 130/89  Pulse: (!) 124  Weight: 198 lb (89.8 kg)    Fetal Status:  Fetal Heart Rate (bpm): 145 Fundal Height: 36 cm Movement: Present Presentation: Vertex  General: Alert, oriented and cooperative. Patient is in no acute distress.  Skin: Skin is warm and dry. No rash noted.   Cardiovascular: Normal heart rate noted  Respiratory: Normal respiratory effort, no problems with respiration noted  Abdomen: Soft, gravid, appropriate for gestational age.  Pain/Pressure: Absent     Pelvic: Cervical exam deferred        Extremities: Normal range of motion.  Edema: None  Mental Status: Normal mood and affect. Normal behavior. Normal judgment and thought content.   Assessment and Plan:  Pregnancy: G3P2002 at [redacted]w[redacted]d 1. Supervision of high risk pregnancy in third trimester (Primary) - Doing well, feeling regular and vigorous fetal movement   2. [redacted] weeks gestation of pregnancy - Routine OB care including GBS today - GC/Chlamydia probe amp ([place])not at [facility] - Strep Gp B Culture+Rflx  3. Chronic  hypertension - BP stable today, IOL scheduled at [redacted]w[redacted]d per pt request for childcare reasons - IOL 8/12, orders placed.  4. Decreased thyroid  stimulating hormone (TSH) level - Last thyroid  panel normal for pregnancy  Preterm labor symptoms and general obstetric precautions including but not limited to vaginal bleeding, contractions, leaking of fluid and fetal movement were reviewed in detail with the patient. Please refer to After Visit Summary for other counseling recommendations.   Return in about 1 week (around 01/04/2024) for IN-PERSON, LOB.  Future Appointments  Date Time Provider Department Center  12/29/2023  3:15 PM [facility] NST [facility]  01/05/2024 11:15 AM [facility] PROVIDER 1 [facility]  01/05/2024 11:30 AM [facility] US1 [facility]  01/20/2024  7:15 AM MC-LD SCHED ROOM MC-INDC None  01/27/2024  9:35 AM Lola, Donnice HERO, MD [facility]   [place]ell JONELLE Finder, CNM

## 2023-12-28 NOTE — Telephone Encounter (Signed)
 Called to ask if patient would be interested in the IMPACT MOM study. No answer. Will try back at later date. Left VM to return call.

## 2023-12-29 ENCOUNTER — Ambulatory Visit

## 2023-12-29 LAB — GC/CHLAMYDIA PROBE AMP (~~LOC~~) NOT AT ARMC
Chlamydia: NEGATIVE
Comment: NEGATIVE
Comment: NORMAL
Neisseria Gonorrhea: NEGATIVE

## 2024-01-01 LAB — STREP GP B CULTURE+RFLX: Strep Gp B Culture+Rflx: NEGATIVE

## 2024-01-03 ENCOUNTER — Telehealth: Payer: Self-pay | Admitting: Family Medicine

## 2024-01-03 NOTE — Telephone Encounter (Signed)
 I called the patient earlier today after she canceled an OB appointment to reschedule it. She expressed that she has been cancelling her prenatal visits because the afternoon times interfere with her kids school schedule. We went ahead and moved her time to the morning instead of the afternoon to accommodate her last two prenatal visits.

## 2024-01-04 ENCOUNTER — Encounter: Admitting: Family Medicine

## 2024-01-05 ENCOUNTER — Other Ambulatory Visit: Payer: Self-pay | Admitting: *Deleted

## 2024-01-05 ENCOUNTER — Ambulatory Visit: Attending: Maternal & Fetal Medicine

## 2024-01-05 ENCOUNTER — Ambulatory Visit: Admitting: Obstetrics

## 2024-01-05 VITALS — BP 137/87 | HR 100

## 2024-01-05 DIAGNOSIS — O99213 Obesity complicating pregnancy, third trimester: Secondary | ICD-10-CM | POA: Diagnosis present

## 2024-01-05 DIAGNOSIS — O0993 Supervision of high risk pregnancy, unspecified, third trimester: Secondary | ICD-10-CM

## 2024-01-05 DIAGNOSIS — O10913 Unspecified pre-existing hypertension complicating pregnancy, third trimester: Secondary | ICD-10-CM | POA: Insufficient documentation

## 2024-01-05 DIAGNOSIS — O10013 Pre-existing essential hypertension complicating pregnancy, third trimester: Secondary | ICD-10-CM

## 2024-01-05 DIAGNOSIS — O99810 Abnormal glucose complicating pregnancy: Secondary | ICD-10-CM

## 2024-01-05 DIAGNOSIS — O43193 Other malformation of placenta, third trimester: Secondary | ICD-10-CM

## 2024-01-05 DIAGNOSIS — I10 Essential (primary) hypertension: Secondary | ICD-10-CM | POA: Diagnosis present

## 2024-01-05 DIAGNOSIS — Z3A37 37 weeks gestation of pregnancy: Secondary | ICD-10-CM | POA: Diagnosis present

## 2024-01-05 DIAGNOSIS — E669 Obesity, unspecified: Secondary | ICD-10-CM

## 2024-01-05 DIAGNOSIS — R7303 Prediabetes: Secondary | ICD-10-CM | POA: Diagnosis present

## 2024-01-05 NOTE — Progress Notes (Signed)
 MFM Consult Note  Kimberly Farley is currently at 37 weeks and 3 days.  She has been followed due to chronic hypertension treated with nifedipine  30 mg daily.    She denies any problems since her last exam.  Her blood pressure today was 137/87.  On today's exam, the overall EFW of 6 pounds 6 ounces measures at the 28th percentile for her gestational age.    There was normal amniotic fluid noted with a total AFI of 20.42 cm.  A BPP performed today was 8 out of 8.  Due to chronic hypertension, she is already scheduled for an induction of labor on January 20, 2024.    She should continue weekly fetal testing until delivery.    She will return in 1 week for an NST.  The patient stated that all of her questions were answered today.  A total of 10 minutes was spent counseling and coordinating the care for this patient.  Greater than 50% of the time was spent in direct face-to-face contact.

## 2024-01-06 ENCOUNTER — Encounter: Admitting: Family Medicine

## 2024-01-10 ENCOUNTER — Encounter: Payer: Self-pay | Admitting: Obstetrics and Gynecology

## 2024-01-10 ENCOUNTER — Other Ambulatory Visit: Payer: Self-pay

## 2024-01-10 ENCOUNTER — Encounter (HOSPITAL_COMMUNITY): Payer: Self-pay | Admitting: *Deleted

## 2024-01-10 ENCOUNTER — Ambulatory Visit (INDEPENDENT_AMBULATORY_CARE_PROVIDER_SITE_OTHER): Admitting: Obstetrics and Gynecology

## 2024-01-10 ENCOUNTER — Telehealth (HOSPITAL_COMMUNITY): Payer: Self-pay | Admitting: *Deleted

## 2024-01-10 VITALS — BP 127/91 | HR 128 | Wt 201.6 lb

## 2024-01-10 DIAGNOSIS — Z3A38 38 weeks gestation of pregnancy: Secondary | ICD-10-CM

## 2024-01-10 DIAGNOSIS — O0993 Supervision of high risk pregnancy, unspecified, third trimester: Secondary | ICD-10-CM | POA: Diagnosis not present

## 2024-01-10 DIAGNOSIS — I10 Essential (primary) hypertension: Secondary | ICD-10-CM | POA: Diagnosis not present

## 2024-01-10 DIAGNOSIS — O321XX0 Maternal care for breech presentation, not applicable or unspecified: Secondary | ICD-10-CM

## 2024-01-10 NOTE — Telephone Encounter (Signed)
 Preadmission screen

## 2024-01-10 NOTE — Progress Notes (Signed)
 PRENATAL VISIT NOTE  Subjective:  Kimberly Farley is a 33 y.o. G3P2002 at [redacted]w[redacted]d being seen today for ongoing prenatal care.  She is currently monitored for the following issues for this high-risk pregnancy and has Chronic hypertension; Prediabetes; Atypical squamous cell changes of undetermined significance (ASCUS) with negative high risk human papilloma virus (HPV) pap smear 03/20/21; Supervision of high-risk pregnancy; and Decreased thyroid  stimulating hormone (TSH) level on their problem list.  Patient reports feels like she might be in labor. Feels like she has a lot of pressure and needs to poop all the time. Feels like she has a lot of discharge. Contractions: Irritability. Vag. Bleeding: None.  Movement: Present. Denies leaking of fluid.   The following portions of the patient's history were reviewed and updated as appropriate: allergies, current medications, past family history, past medical history, past social history, past surgical history and problem list.   Objective:    Vitals:   01/10/24 1003 01/10/24 1052  BP: (!) 143/95 (!) 127/91  Pulse: (!) 128 (!) 128  Weight: 201 lb 9.6 oz (91.4 kg)     Fetal Status:  Fetal Heart Rate (bpm): 156   Movement: Present    General: Alert, oriented and cooperative. Patient is in no acute distress.  Skin: Skin is warm and dry. No rash noted.   Cardiovascular: Normal heart rate noted  Respiratory: Normal respiratory effort, no problems with respiration noted  Abdomen: Soft, gravid, appropriate for gestational age.  Pain/Pressure: Present     Pelvic: Cervical exam performed in the presence of a chaperone        Extremities: Normal range of motion.  Edema: None  Mental Status: Normal mood and affect. Normal behavior. Normal judgment and thought content.   Bedside US : frank breech, head to maternal upper right  Pt informed that the ultrasound is considered a limited OB ultrasound and is not intended to be a complete ultrasound exam.   Patient also informed that the ultrasound is not being completed with the intent of assessing for fetal or placental anomalies or any pelvic abnormalities.  Explained that the purpose of today's ultrasound is to assess for  presentation.  Patient acknowledges the purpose of the exam and the limitations of the study.    Assessment and Plan:  Pregnancy: G3P2002 at [redacted]w[redacted]d  1. Chronic hypertension (Primary) - Cont nifedipine  30 mg XL, did not take her meds this am - Normal BPP 8/28 - Has IOL for 01/20/24  2. Supervision of high risk pregnancy in third trimester  3. [redacted] weeks gestation of pregnancy  4. Frank breech presentation, single or unspecified fetus - Head to maternal right - Counseled regarding ECV vs scheduled CS, reviewed risks/benefits - she would like to proceed with ECV, scheduled for first available 01/13/24   Term labor symptoms and general obstetric precautions including but not limited to vaginal bleeding, contractions, leaking of fluid and fetal movement were reviewed in detail with the patient. Please refer to After Visit Summary for other counseling recommendations.   Return in about 1 week (around 01/17/2024) for high OB.  Future Appointments  Date Time Provider Department Center  01/12/2024 10:30 AM [facility] PROVIDER 1 [facility]  01/12/2024 10:45 AM [facility] NST [facility]  01/17/2024 10:15 AM Eveline Lynwood MATSU, MD [facility]  01/19/2024 10:30 AM [facility] PROVIDER 1 [facility]  01/19/2024 10:45 AM [facility] NST [facility]  01/20/2024  7:15 AM MC-LD SCHED ROOM MC-INDC None  02/29/2024  9:55 AM Fredirick Glenys RAMAN, MD [facility]    Burnard CHRISTELLA Moats, MD

## 2024-01-12 ENCOUNTER — Ambulatory Visit: Attending: Obstetrics and Gynecology

## 2024-01-12 ENCOUNTER — Ambulatory Visit (HOSPITAL_BASED_OUTPATIENT_CLINIC_OR_DEPARTMENT_OTHER): Admitting: Obstetrics

## 2024-01-12 VITALS — BP 136/88 | HR 95

## 2024-01-12 DIAGNOSIS — Z3A38 38 weeks gestation of pregnancy: Secondary | ICD-10-CM | POA: Diagnosis not present

## 2024-01-12 DIAGNOSIS — O10013 Pre-existing essential hypertension complicating pregnancy, third trimester: Secondary | ICD-10-CM

## 2024-01-12 DIAGNOSIS — O10913 Unspecified pre-existing hypertension complicating pregnancy, third trimester: Secondary | ICD-10-CM | POA: Diagnosis not present

## 2024-01-12 DIAGNOSIS — I10 Essential (primary) hypertension: Secondary | ICD-10-CM

## 2024-01-12 DIAGNOSIS — O0993 Supervision of high risk pregnancy, unspecified, third trimester: Secondary | ICD-10-CM

## 2024-01-12 NOTE — Procedures (Signed)
 Kimberly Farley 1990/07/11 [redacted]w[redacted]d  Fetus A Non-Stress Test Interpretation for 01/12/24  Indication: Chronic Hypertenstion - NST only  Fetal Heart Rate A Mode: External Baseline Rate (A): 140 bpm Variability: Moderate Accelerations: 15 x 15 Decelerations: None Multiple birth?: No  Uterine Activity Mode: Palpation, Toco Contraction Frequency (min): none noted Resting Tone Palpated: Relaxed  Interpretation (Fetal Testing) Nonstress Test Interpretation: Reactive Comments: Reviewed with Dr. Ileana

## 2024-01-13 ENCOUNTER — Other Ambulatory Visit: Payer: Self-pay

## 2024-01-13 ENCOUNTER — Encounter (HOSPITAL_COMMUNITY): Payer: Self-pay | Admitting: Obstetrics and Gynecology

## 2024-01-13 ENCOUNTER — Encounter (HOSPITAL_COMMUNITY): Payer: Self-pay | Admitting: Anesthesiology

## 2024-01-13 ENCOUNTER — Encounter: Admitting: Family Medicine

## 2024-01-13 ENCOUNTER — Observation Stay (HOSPITAL_COMMUNITY)

## 2024-01-13 ENCOUNTER — Observation Stay (HOSPITAL_COMMUNITY)
Admission: RE | Admit: 2024-01-13 | Discharge: 2024-01-13 | Disposition: A | Discharge status: Home / Self Care | Attending: Obstetrics and Gynecology | Admitting: Obstetrics and Gynecology

## 2024-01-13 DIAGNOSIS — Z3A38 38 weeks gestation of pregnancy: Secondary | ICD-10-CM | POA: Insufficient documentation

## 2024-01-13 DIAGNOSIS — O321XX0 Maternal care for breech presentation, not applicable or unspecified: Principal | ICD-10-CM | POA: Insufficient documentation

## 2024-01-13 NOTE — H&P (Signed)
 LABOR AND DELIVERY ADMISSION HISTORY AND PHYSICAL NOTE  Kimberly Farley is a 33 y.o. female G43P2002 with IUP at [redacted]w[redacted]d  presenting for ecv.   She reports positive fetal movement. She denies leakage of fluid or vaginal bleeding.  Prenatal History/Complications:  Past Medical History: Past Medical History:  Diagnosis Date   Hypertension     Past Surgical History: Past Surgical History:  Procedure Laterality Date   APPENDECTOMY     LAPAROSCOPIC APPENDECTOMY Right 12/31/2015   Procedure: APPENDECTOMY LAPAROSCOPIC;  Surgeon: Alm Angle, MD;  Location: [facility];  Service: General;  Laterality: Right;    Obstetrical History: OB History     Gravida  3   Para  2   Term  2   Preterm  0   AB  0   Living  2      SAB  0   IAB  0   Ectopic  0   Multiple  0   Live Births  2           Social History: Social History   Socioeconomic History   Marital status: Married    Spouse name: Not on file   Number of children: Not on file   Years of education: Not on file   Highest education level: Bachelor's degree (e.g., BA, AB, BS)  Occupational History   Not on file  Tobacco Use   Smoking status: Never    Passive exposure: Never   Smokeless tobacco: Never  Vaping Use   Vaping status: Never Used  Substance and Sexual Activity   Alcohol use: Not Currently   Drug use: No   Sexual activity: Yes    Birth control/protection: None  Other Topics Concern   Not on file  Social History Narrative   Not on file   Social Drivers of Health   Financial Resource Strain: Low Risk  (12/15/2023)   Overall Financial Resource Strain (CARDIA)    Difficulty of Paying Living Expenses: Not hard at all  Food Insecurity: No Food Insecurity (01/13/2024)   Hunger Vital Sign    Worried About Running Out of Food in the Last Year: Never true    Ran Out of Food in the Last Year: Never true  Transportation Needs: No Transportation Needs (01/13/2024)   PRAPARE - Scientist, research (physical sciences) (Medical): No    Lack of Transportation (Non-Medical): No  Physical Activity: Insufficiently Active (12/15/2023)   Exercise Vital Sign    Days of Exercise per Week: 2 days    Minutes of Exercise per Session: 10 min  Stress: No Stress Concern Present (12/15/2023)   [employer] of Occupational Health - Occupational Stress Questionnaire    Feeling of Stress: Not at all  Social Connections: Moderately Isolated (12/15/2023)   Social Connection and Isolation Panel    Frequency of Communication with Friends and Family: Twice a week    Frequency of Social Gatherings with Friends and Family: Twice a week    Attends Religious Services: Never    Database administrator or Organizations: No    Attends Engineer, structural: Not on file    Marital Status: Married    Family History: Family History  Problem Relation Age of Onset   Hypertension Mother    Hypertension Maternal Grandmother    Stroke Maternal Grandmother    Hearing loss Maternal Grandmother    Alcohol abuse Maternal Grandfather     Allergies: Allergies  Allergen Reactions   Penicillins Nausea Only  Has patient had a PCN reaction causing immediate rash, facial/tongue/throat swelling, SOB or lightheadedness with hypotension: No Has patient had a PCN reaction causing severe rash involving mucus membranes or skin necrosis: No Has patient had a PCN reaction that required hospitalization No Has patient had a PCN reaction occurring within the last 10 years: Yes If all of the above answers are NO, then may proceed with Cephalosporin use.     Medications Prior to Admission  Medication Sig Dispense Refill Last Dose/Taking   aspirin  EC 81 MG tablet Take 1 tablet (81 mg total) by mouth daily. Take after 12 weeks for prevention of preeclampsia later in pregnancy 90 tablet 2 01/12/2024   NIFEdipine  (ADALAT  CC) 30 MG 24 hr tablet Take 1 tablet (30 mg total) by mouth daily. 90 tablet 3 01/13/2024 Morning   Prenatal  Vit-Fe Fumarate-FA (PRENATAL PLUS VITAMIN/MINERAL) 27-1 MG TABS Take 1 tablet by mouth daily. 30 tablet 11 01/12/2024   Blood Pressure Monitoring (BLOOD PRESSURE KIT) DEVI 1 Device by Does not apply route daily. To monitor BP at home weekly icd 10 dx: o09.90 1 each 0    Misc. Devices (GOJJI WEIGHT SCALE) MISC 1 Device by Does not apply route daily. To monitor weight at home weekly icd 10 dx: o09.90 1 each 0      Review of Systems   All systems reviewed and negative except as stated in HPI  Blood pressure 132/83, pulse 97, temperature 98.1 F (36.7 C), temperature source Oral, resp. rate 18, height 5' 2 (1.575 m), weight 91.2 kg, last menstrual period 04/18/2023, currently breastfeeding. General appearance:  Lungs: clear to auscultation bilaterally Heart: regular rate and rhythm Abdomen: soft, non-tender; bowel sounds normal Extremities: No calf swelling or tenderness Presentation: cephalic by u/s Fetal monitoring: reactive nst Uterine activity: quiet Exam by:: Dr. Kandis  Assessment: Kimberly Farley is a 33 y.o. [id number] at [redacted]w[redacted]d here for ecv. Found to be vertex by u/s. 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 Nst today is reactive. She is scheduled for iol at 39+4. We normally induce patients with her hypertensive disorder between 37 and 39 weeks. No room on l and d today but an induction spot is available tomorrow, so we have re-scheduled her for then.    Kimberly Farley Kimberly Farley Kimberly Farley 01/13/2024, 9:46 AM

## 2024-01-13 NOTE — Discharge Summary (Signed)
 Patient presented for ECV at 38+4, found to be vertex. Has chtn on meds, asymptomatic, bps mild range. Nst reactive. Iol scheduled for tomorrow.

## 2024-01-13 NOTE — Progress Notes (Signed)
 Pt d/c home with husband.  D/c instructions reviewed with pt.

## 2024-01-14 ENCOUNTER — Inpatient Hospital Stay (HOSPITAL_COMMUNITY): Admission: RE | Admit: 2024-01-14 | Source: Home / Self Care | Admitting: Family Medicine

## 2024-01-14 ENCOUNTER — Inpatient Hospital Stay (HOSPITAL_COMMUNITY)

## 2024-01-15 ENCOUNTER — Inpatient Hospital Stay (HOSPITAL_COMMUNITY)
Admission: RE | Admit: 2024-01-15 | Discharge: 2024-01-18 | DRG: 768 | Disposition: A | Discharge status: Home / Self Care | Attending: Family Medicine | Admitting: Family Medicine

## 2024-01-15 ENCOUNTER — Encounter (HOSPITAL_COMMUNITY): Payer: Self-pay | Admitting: Family Medicine

## 2024-01-15 DIAGNOSIS — R7989 Other specified abnormal findings of blood chemistry: Secondary | ICD-10-CM

## 2024-01-15 DIAGNOSIS — Z3A38 38 weeks gestation of pregnancy: Secondary | ICD-10-CM | POA: Diagnosis not present

## 2024-01-15 DIAGNOSIS — Z8249 Family history of ischemic heart disease and other diseases of the circulatory system: Secondary | ICD-10-CM | POA: Diagnosis not present

## 2024-01-15 DIAGNOSIS — Z302 Encounter for sterilization: Secondary | ICD-10-CM

## 2024-01-15 DIAGNOSIS — I10 Essential (primary) hypertension: Secondary | ICD-10-CM | POA: Diagnosis present

## 2024-01-15 DIAGNOSIS — O0993 Supervision of high risk pregnancy, unspecified, third trimester: Principal | ICD-10-CM

## 2024-01-15 DIAGNOSIS — O1092 Unspecified pre-existing hypertension complicating childbirth: Secondary | ICD-10-CM | POA: Diagnosis present

## 2024-01-15 DIAGNOSIS — R7303 Prediabetes: Secondary | ICD-10-CM | POA: Diagnosis present

## 2024-01-15 DIAGNOSIS — O1002 Pre-existing essential hypertension complicating childbirth: Secondary | ICD-10-CM | POA: Diagnosis not present

## 2024-01-15 DIAGNOSIS — O365930 Maternal care for other known or suspected poor fetal growth, third trimester, not applicable or unspecified: Secondary | ICD-10-CM | POA: Diagnosis not present

## 2024-01-15 DIAGNOSIS — Z880 Allergy status to penicillin: Secondary | ICD-10-CM

## 2024-01-15 DIAGNOSIS — O43893 Other placental disorders, third trimester: Secondary | ICD-10-CM | POA: Diagnosis not present

## 2024-01-15 DIAGNOSIS — Z3A39 39 weeks gestation of pregnancy: Secondary | ICD-10-CM | POA: Diagnosis not present

## 2024-01-15 DIAGNOSIS — O26893 Other specified pregnancy related conditions, third trimester: Secondary | ICD-10-CM | POA: Diagnosis present

## 2024-01-15 LAB — COMPREHENSIVE METABOLIC PANEL WITH GFR
ALT: 17 U/L (ref 0–44)
ALT: 16 U/L (ref 0–44)
AST: 23 U/L (ref 15–41)
AST: 23 U/L (ref 15–41)
Albumin: 2.9 g/dL — ABNORMAL LOW (ref 3.5–5.0)
Albumin: 2.6 g/dL — ABNORMAL LOW (ref 3.5–5.0)
Alkaline Phosphatase: 104 U/L (ref 38–126)
Alkaline Phosphatase: 87 U/L (ref 38–126)
Anion gap: 12 (ref 5–15)
Anion gap: 11 (ref 5–15)
BUN: 6 mg/dL (ref 6–20)
BUN: 5 mg/dL — ABNORMAL LOW (ref 6–20)
CO2: 18 mmol/L — ABNORMAL LOW (ref 22–32)
CO2: 19 mmol/L — ABNORMAL LOW (ref 22–32)
Calcium: 9.6 mg/dL (ref 8.9–10.3)
Calcium: 9 mg/dL (ref 8.9–10.3)
Chloride: 105 mmol/L (ref 98–111)
Chloride: 104 mmol/L (ref 98–111)
Creatinine, Ser: 0.58 mg/dL (ref 0.44–1.00)
Creatinine, Ser: 0.57 mg/dL (ref 0.44–1.00)
GFR, Estimated: >60 mL/min (ref >60)
GFR, Estimated: >60 mL/min (ref >60)
Glucose, Bld: 69 mg/dL — ABNORMAL LOW (ref 70–99)
Glucose, Bld: 96 mg/dL (ref 70–99)
Potassium: 3.6 mmol/L (ref 3.5–5.1)
Potassium: 3.9 mmol/L (ref 3.5–5.1)
Sodium: 135 mmol/L (ref 135–145)
Sodium: 134 mmol/L — ABNORMAL LOW (ref 135–145)
Total Bilirubin: 0.6 mg/dL (ref 0.0–1.2)
Total Bilirubin: 0.5 mg/dL (ref 0.0–1.2)
Total Protein: 7.4 g/dL (ref 6.5–8.1)
Total Protein: 6.3 g/dL — ABNORMAL LOW (ref 6.5–8.1)

## 2024-01-15 LAB — PROTEIN / CREATININE RATIO, URINE
Creatinine, Urine: 182 mg/dL
Protein Creatinine Ratio: 0.11 mg/mg{creat} (ref 0.00–0.15)
Total Protein, Urine: 20 mg/dL

## 2024-01-15 LAB — CBC
HCT: 40.8 % (ref 36.0–46.0)
HCT: 37.2 % (ref 36.0–46.0)
Hemoglobin: 14.3 g/dL (ref 12.0–15.0)
Hemoglobin: 12.7 g/dL (ref 12.0–15.0)
MCH: 32 pg (ref 26.0–34.0)
MCH: 32.1 pg (ref 26.0–34.0)
MCHC: 35 g/dL (ref 30.0–36.0)
MCHC: 34.1 g/dL (ref 30.0–36.0)
MCV: 91.3 fL (ref 80.0–100.0)
MCV: 93.9 fL (ref 80.0–100.0)
Platelets: 312 K/uL (ref 150–400)
Platelets: 257 K/uL (ref 150–400)
RBC: 4.47 MIL/uL (ref 3.87–5.11)
RBC: 3.96 MIL/uL (ref 3.87–5.11)
RDW: 12.7 % (ref 11.5–15.5)
RDW: 12.9 % (ref 11.5–15.5)
WBC: 10.5 K/uL (ref 4.0–10.5)
WBC: 12.4 K/uL — ABNORMAL HIGH (ref 4.0–10.5)
nRBC: 0 % (ref 0.0–0.2)
nRBC: 0 % (ref 0.0–0.2)

## 2024-01-15 LAB — TYPE AND SCREEN
ABO/RH(D): O POS
Antibody Screen: NEGATIVE

## 2024-01-15 MED ORDER — NIFEDIPINE ER OSMOTIC RELEASE 30 MG PO TB24: 30.0000 mg | ORAL_TABLET | Freq: Every day | ORAL | Status: DC

## 2024-01-15 MED ORDER — MISOPROSTOL 50MCG HALF TABLET: 50.0000 ug | ORAL_TABLET | ORAL | Status: DC | PRN

## 2024-01-15 MED ORDER — TERBUTALINE SULFATE 1 MG/ML IJ SOLN: 0.2500 mg | Freq: Once | INTRAMUSCULAR | Status: DC | PRN

## 2024-01-15 MED ORDER — ACETAMINOPHEN 500 MG PO TABS
1000.0000 mg | ORAL_TABLET | Freq: Four times a day (QID) | ORAL | Status: DC | PRN
  Administered 2024-01-15: 1000 mg via ORAL
  Filled 2024-01-15: qty 2

## 2024-01-15 MED ORDER — FENTANYL CITRATE (PF) 100 MCG/2ML IJ SOLN
50.0000 ug | INTRAMUSCULAR | Status: DC | PRN
  Administered 2024-01-16 (×2): 100 ug via INTRAVENOUS
  Filled 2024-01-15 (×2): qty 2

## 2024-01-15 MED ORDER — FLEET ENEMA RE ENEM: 1.0000 | ENEMA | RECTAL | Status: DC | PRN

## 2024-01-15 MED ORDER — LACTATED RINGERS IV SOLN: INTRAVENOUS | Status: DC

## 2024-01-15 MED ORDER — OXYTOCIN-SODIUM CHLORIDE 30-0.9 UT/500ML-% IV SOLN
1.0000 m[IU]/min | INTRAVENOUS | Status: DC
  Administered 2024-01-15: 2 m[IU]/min via INTRAVENOUS
  Filled 2024-01-15: qty 500

## 2024-01-15 MED ORDER — OXYCODONE-ACETAMINOPHEN 5-325 MG PO TABS: 2.0000 | ORAL_TABLET | ORAL | Status: DC | PRN

## 2024-01-15 MED ORDER — OXYCODONE-ACETAMINOPHEN 5-325 MG PO TABS: 1.0000 | ORAL_TABLET | ORAL | Status: DC | PRN

## 2024-01-15 MED ORDER — LIDOCAINE HCL (PF) 1 % IJ SOLN: 30.0000 mL | INTRAMUSCULAR | Status: DC | PRN

## 2024-01-15 MED ORDER — OXYTOCIN BOLUS FROM INFUSION
333.0000 mL | Freq: Once | INTRAVENOUS | Status: AC
  Administered 2024-01-16 (×2): 333 mL via INTRAVENOUS

## 2024-01-15 MED ORDER — OXYTOCIN-SODIUM CHLORIDE 30-0.9 UT/500ML-% IV SOLN: 2.5000 [IU]/h | INTRAVENOUS | Status: DC

## 2024-01-15 MED ORDER — ACETAMINOPHEN 325 MG PO TABS: 650.0000 mg | ORAL_TABLET | ORAL | Status: DC | PRN

## 2024-01-15 MED ORDER — LACTATED RINGERS IV SOLN
500.0000 mL | INTRAVENOUS | Status: DC | PRN
  Administered 2024-01-15: 500 mL via INTRAVENOUS

## 2024-01-15 MED ORDER — SOD CITRATE-CITRIC ACID 500-334 MG/5ML PO SOLN: 30.0000 mL | ORAL | Status: DC | PRN

## 2024-01-15 MED ORDER — ONDANSETRON HCL 4 MG/2ML IJ SOLN: 4.0000 mg | Freq: Four times a day (QID) | INTRAMUSCULAR | Status: DC | PRN

## 2024-01-15 NOTE — Progress Notes (Signed)
 Labor Progress Note Kimberly Farley is a 33 y.o. G3P2002 at [redacted]w[redacted]d presented for IOL 2/2 cHTN  S:  Coping well with nitrous and breathing  O:  BP (!) 133/92   Pulse (!) 101   Temp 97.9 F (36.6 C) (Oral)   Resp 16   Ht 5' 2 (1.575 m)   Wt 92.2 kg   LMP 04/18/2023   SpO2 99%   BMI 37.18 kg/m   EFM: baseline 130 bpm/ moderate variability/ 15x15 accels/ absent decels  Toco/IUPC: 3-6 SVE: Dilation: 6 Effacement (%): 80 Cervical Position: Posterior Station: -2 Presentation: Vertex Exam by:: Camie Butler Potters, CNM Pitocin : 8 mu/min  A/P: 33 y.o. G3P2002 [redacted]w[redacted]d IOL 2/2 cHTN  1. Labor: IOL in latent phase s/p pitocin . RBA of AROM d/w patient, patient desires AROM. AROM completed, large clear fluid noted. Patient and fetus tolerated well. Dilation changed from 5 to 6cm by end of exam. Anticipate good progress in active phase of labor now.  2. FWB: Cat 1 3. Pain: Coping well with breath work and nitrous.  4. GBS negative 5. New onset HA. RN to administer 1g Tylenol . Will repeat PEC labs.    Anticipate NVSB.  Camie DELENA Rote, CNM 9:36 PM

## 2024-01-15 NOTE — H&P (Signed)
 OBSTETRIC ADMISSION HISTORY AND PHYSICAL  Kimberly Farley is a 33 y.o. female G102P2002 with IUP at [redacted]w[redacted]d presenting for IOL. She reports +FMs, no LOF, no VB, no blurry vision, no headaches, no peripheral edema, and no RUQ pain.  She plans on feeding pumped breast milk from bottles and using Enfamil as her milk supply comes in. She requested BTL for birth control. She received her prenatal care at [facility].   Dating: By LMP c/w U/S at 7 wks => Estimated Date of Delivery: 01/23/24  Sono:    @[redacted]w[redacted]d , CWD, normal anatomy, cephalic presentation, 2900g, 71% EFW   Prenatal History/Complications:         NURSING  PROVIDER  [facility] Dating by LMP c/w U/S at 7 wks  [facility] Model Traditional Anatomy U/S    Initiated care at  [employer]  English               LAB RESULTS   Support Person Dasie (husband) Genetics NIPS: LR female AFP: neg      NT/IT (FT only)        Carrier Screen Horizon: Negative 2022   Rhogam  O/Positive/-- (03/05 1148) A1C/GTT Early HgbA1C:  Third trimester 2 hr GTT: neg  Flu Vaccine        TDaP Vaccine   Blood Type O/Positive/-- (03/05 1148)  RSV Vaccine   Antibody Negative (03/05 1148)  COVID Vaccine  x2 Rubella 25.70 (03/05 1148)  Feeding Plan both RPR Non Reactive (03/05 1148)  Contraception bilateral tubal ligation - signed consent HBsAg Negative (03/05 1148)  Circumcision   HIV Non Reactive (03/05 1148)  [facility]  HCVAb Non Reactive (03/05 1148)  Prenatal Classes        BTL Consent   Pap       Diagnosis  Date Value Ref Range Status  07/13/2023     Final    - Negative for intraepithelial lesion or malignancy (NILM)    BTL Pre-payment   GC/CT Initial:   36wks:    VBAC Consent   GBS For PCN allergy, check sensitivities   BRx Optimized? [ ]  yes   [ ]  no      DME Rx [ ]  BP cuff [ ]  Weight Scale Waterbirth  [ ]  Class [ ]  Consent [ ]  CNM visit  PHQ9 & GAD7 [  ] new OB [  ] 28 weeks  [  ]  36 weeks Induction  [ ]  Orders Entered [ ] Foley Y/N       Past Medical History: Past Medical History:  Diagnosis Date   Hypertension     Past Surgical History: Past Surgical History:  Procedure Laterality Date   APPENDECTOMY     LAPAROSCOPIC APPENDECTOMY Right 12/31/2015   Procedure: APPENDECTOMY LAPAROSCOPIC;  Surgeon: Alm Angle, MD;  Location: [facility];  Service: General;  Laterality: Right;    Obstetrical History: OB History     Gravida  3   Para  2   Term  2   Preterm  0   AB  0   Living  2      SAB  0   IAB  0   Ectopic  0   Multiple  0   Live Births  2           Social History Social History   Socioeconomic History  Marital status: Married    Spouse name: Not on file   Number of children: Not on file   Years of education: Not on file   Highest education level: Bachelor's degree (e.g., BA, AB, BS)  Occupational History   Not on file  Tobacco Use   Smoking status: Never    Passive exposure: Never   Smokeless tobacco: Never  Vaping Use   Vaping status: Never Used  Substance and Sexual Activity   Alcohol use: Not Currently   Drug use: No   Sexual activity: Yes    Birth control/protection: None  Other Topics Concern   Not on file  Social History Narrative   Not on file   Social Drivers of Health   Financial Resource Strain: Low Risk  (12/15/2023)   Overall Financial Resource Strain (CARDIA)    Difficulty of Paying Living Expenses: Not hard at all  Food Insecurity: No Food Insecurity (01/15/2024)   Hunger Vital Sign    Worried About Running Out of Food in the Last Year: Never true    Ran Out of Food in the Last Year: Never true  Transportation Needs: No Transportation Needs (01/13/2024)   PRAPARE - Administrator, Civil Service (Medical): No    Lack of Transportation (Non-Medical): No  Physical Activity: Insufficiently Active (12/15/2023)   Exercise Vital Sign    Days of Exercise per Week: 2 days    Minutes of Exercise  per Session: 10 min  Stress: No Stress Concern Present (12/15/2023)   [employer] of Occupational Health - Occupational Stress Questionnaire    Feeling of Stress: Not at all  Social Connections: Moderately Isolated (12/15/2023)   Social Connection and Isolation Panel    Frequency of Communication with Friends and Family: Twice a week    Frequency of Social Gatherings with Friends and Family: Twice a week    Attends Religious Services: Never    Database administrator or Organizations: No    Attends Engineer, structural: Not on file    Marital Status: Married    Family History: Family History  Problem Relation Age of Onset   Hypertension Mother    Hypertension Maternal Grandmother    Stroke Maternal Grandmother    Hearing loss Maternal Grandmother    Alcohol abuse Maternal Grandfather     Allergies: Allergies  Allergen Reactions   Penicillins Nausea Only    Has patient had a PCN reaction causing immediate rash, facial/tongue/throat swelling, SOB or lightheadedness with hypotension: No Has patient had a PCN reaction causing severe rash involving mucus membranes or skin necrosis: No Has patient had a PCN reaction that required hospitalization No Has patient had a PCN reaction occurring within the last 10 years: Yes If all of the above answers are NO, then may proceed with Cephalosporin use.     Medications Prior to Admission  Medication Sig Dispense Refill Last Dose/Taking   aspirin  EC 81 MG tablet Take 1 tablet (81 mg total) by mouth daily. Take after 12 weeks for prevention of preeclampsia later in pregnancy 90 tablet 2    Blood Pressure Monitoring (BLOOD PRESSURE KIT) DEVI 1 Device by Does not apply route daily. To monitor BP at home weekly icd 10 dx: o09.90 1 each 0    Misc. Devices (GOJJI WEIGHT SCALE) MISC 1 Device by Does not apply route daily. To monitor weight at home weekly icd 10 dx: o09.90 1 each 0    NIFEdipine  (ADALAT  CC) 30 MG 24 hr  tablet Take 1  tablet (30 mg total) by mouth daily. 90 tablet 3    Prenatal Vit-Fe Fumarate-FA (PRENATAL PLUS VITAMIN/MINERAL) 27-1 MG TABS Take 1 tablet by mouth daily. 30 tablet 11      Review of Systems   All systems reviewed and negative except as stated in HPI  Blood pressure (!) 139/99, pulse 92, temperature 97.7 F (36.5 C), temperature source Oral, resp. rate 16, height 5' 2 (1.575 m), weight 92.2 kg, last menstrual period 04/18/2023, currently breastfeeding. General appearance: alert, cooperative, appears stated age, and no distress with discomfort and labored breathing during contraction Lungs: Normal work of breathing on room air. Heart: Regular rate Abdomen: Gravid Extremities: No sign of DVT Presentation: cephalic by U/S Fetal monitoring Baseline: 140 bpm, Variability: Good {> 6 bpm), Accelerations: 15x15, and Decelerations: Absent Uterine activity Frequency: Every 4 minutes Dilation: 4 Effacement (%): 50 Station: -2 Exam by:: Lima, rn  Prenatal labs: ABO, Rh: --/--/O POS (09/07 1554) Antibody: NEG (09/07 1554) Rubella: 25.70 (03/05 1148) RPR: Non Reactive (06/25 0925)  HBsAg: Negative (03/05 1148)  HIV: Non Reactive (06/25 0925)  GBS: Negative/-- (08/20 1320)    Lab Results  Component Value Date   GBS Negative 12/28/2023   GTT Third trimester 2 hr GTT: neg  Genetic screening  NIPS LR female and AFP neg Anatomy US  within normal limits  Immunization History  Administered Date(s) Administered   PFIZER(Purple Top)SARS-COV-2 Vaccination 09/20/2019, 10/15/2019   Tdap 12/24/2015    Prenatal Transfer Tool  Maternal Diabetes: No Genetic Screening: Normal Maternal Ultrasounds/Referrals: Normal Fetal Ultrasounds or other Referrals:  Referred to Materal Fetal Medicine  Maternal Substance Abuse:  No Significant Maternal Medications:  None Significant Maternal Lab Results: Group B Strep negative Number of Prenatal Visits:greater than 3 verified prenatal visits Maternal  Vaccinations:Covid x 2 Other Comments:  None   Results for orders placed or performed during the hospital encounter of 01/15/24 (from the past 24 hours)  Type and screen   Collection Time: 01/15/24  3:54 PM  Result Value Ref Range   ABO/RH(D) O POS    Antibody Screen NEG    Sample Expiration      01/18/2024,2359 Performed at [facility], [address], [place], [place] [address]   CBC   Collection Time: 01/15/24  3:55 PM  Result Value Ref Range   WBC 10.5 4.0 - 10.5 K/uL   RBC 4.47 3.87 - 5.11 MIL/uL   Hemoglobin 14.3 12.0 - 15.0 g/dL   HCT 59.1 63.9 - 53.9 %   MCV 91.3 80.0 - 100.0 fL   MCH 32.0 26.0 - 34.0 pg   MCHC 35.0 30.0 - 36.0 g/dL   RDW 87.2 88.4 - 84.4 %   Platelets 312 150 - 400 K/uL   nRBC 0.0 0.0 - 0.2 %    Patient Active Problem List   Diagnosis Date Noted   Indication for care in labor or delivery 01/15/2024   Breech presentation 01/13/2024   Decreased thyroid  stimulating hormone (TSH) level 12/27/2023   Supervision of high-risk pregnancy 06/09/2023   Atypical squamous cell changes of undetermined significance (ASCUS) with negative high risk human papilloma virus (HPV) pap smear 03/20/21 03/27/2021   Prediabetes 03/23/2021   Chronic hypertension 03/20/2021    Assessment/Plan:  Kimberly Farley is a 33 y.o. G3P2002 at [redacted]w[redacted]d here for IOL in the setting of cHTN.  #Labor: IOL 2/2 cHTN. Start pitocin . #Pain: Nitrous per patient #FWB: Cat I tracing  #GBS status:  negative #Feeding:  Breastmilk  and Formula: pumped breast milk from bottles and using Enfamil as her milk supply comes in  #Reproductive Life planning: Tubal Ligation #Circ: yes  Jayson LELON Ness, Medical Student  01/15/2024, 5:26 PM  I was present for the exam and agree with above.  Claudene, Makensey Rego , CNM 01/15/2024 10:15 PM

## 2024-01-15 NOTE — Progress Notes (Signed)
 Called to room d/t multiparous, unmedicated patient feeling urge to push with SCE of complete. Sat with patient for several contractions, with patient coping well with nitrous. On 3rd contraction, patient reported more pressure and started bearing down. No bulging of perineum was evident, so consent was obtained for SCE. Cervix still evident and posterior to fetal head. Recommend continuation of nitrous, position change to forward leaning and pursed lip breathing PRN until obvious need to push.   Camie Rote, MSN, CNM, RNC-OB Certified Nurse Midwife, [facility] 01/15/2024 11:10 PM

## 2024-01-16 ENCOUNTER — Encounter (HOSPITAL_COMMUNITY): Payer: Self-pay | Admitting: Family Medicine

## 2024-01-16 DIAGNOSIS — O1002 Pre-existing essential hypertension complicating childbirth: Secondary | ICD-10-CM

## 2024-01-16 DIAGNOSIS — Z3A39 39 weeks gestation of pregnancy: Secondary | ICD-10-CM

## 2024-01-16 LAB — DIC (DISSEMINATED INTRAVASCULAR COAGULATION)PANEL
D-Dimer, Quant: 17.44 ug{FEU}/mL — ABNORMAL HIGH (ref 0.00–0.50)
Fibrinogen: 593 mg/dL — ABNORMAL HIGH (ref 210–475)
INR: 1.1 (ref 0.8–1.2)
Platelets: 253 K/uL (ref 150–400)
Prothrombin Time: 14.3 s (ref 11.4–15.2)
Smear Review: NONE SEEN
aPTT: 26 s (ref 24–36)

## 2024-01-16 LAB — CBC WITH DIFFERENTIAL/PLATELET
Abs Immature Granulocytes: 0.1 K/uL — ABNORMAL HIGH (ref 0.00–0.07)
Basophils Absolute: 0 K/uL (ref 0.0–0.1)
Basophils Relative: 0 %
Eosinophils Absolute: 0 K/uL (ref 0.0–0.5)
Eosinophils Relative: 0 %
HCT: 35.8 % — ABNORMAL LOW (ref 36.0–46.0)
Hemoglobin: 12.2 g/dL (ref 12.0–15.0)
Immature Granulocytes: 1 %
Lymphocytes Relative: 15 %
Lymphs Abs: 2.7 K/uL (ref 0.7–4.0)
MCH: 31.9 pg (ref 26.0–34.0)
MCHC: 34.1 g/dL (ref 30.0–36.0)
MCV: 93.7 fL (ref 80.0–100.0)
Monocytes Absolute: 1.1 K/uL — ABNORMAL HIGH (ref 0.1–1.0)
Monocytes Relative: 6 %
Neutro Abs: 14.2 K/uL — ABNORMAL HIGH (ref 1.7–7.7)
Neutrophils Relative %: 78 %
Platelets: 235 K/uL (ref 150–400)
RBC: 3.82 MIL/uL — ABNORMAL LOW (ref 3.87–5.11)
RDW: 12.6 % (ref 11.5–15.5)
WBC: 18.2 K/uL — ABNORMAL HIGH (ref 4.0–10.5)
nRBC: 0 % (ref 0.0–0.2)

## 2024-01-16 LAB — CBC
HCT: 36.2 % (ref 36.0–46.0)
Hemoglobin: 12.6 g/dL (ref 12.0–15.0)
MCH: 32.1 pg (ref 26.0–34.0)
MCHC: 34.8 g/dL (ref 30.0–36.0)
MCV: 92.3 fL (ref 80.0–100.0)
Platelets: 263 K/uL (ref 150–400)
RBC: 3.92 MIL/uL (ref 3.87–5.11)
RDW: 12.7 % (ref 11.5–15.5)
WBC: 19.4 K/uL — ABNORMAL HIGH (ref 4.0–10.5)
nRBC: 0 % (ref 0.0–0.2)

## 2024-01-16 LAB — RPR: RPR Ser Ql: NONREACTIVE

## 2024-01-16 LAB — POSTPARTUM HEMORRHAGE (BB NOTIFICATION)

## 2024-01-16 MED ORDER — FAMOTIDINE 20 MG PO TABS
40.0000 mg | ORAL_TABLET | Freq: Once | ORAL | Status: AC
  Administered 2024-01-17: 40 mg via ORAL
  Filled 2024-01-16: qty 2

## 2024-01-16 MED ORDER — IBUPROFEN 600 MG PO TABS
600.0000 mg | ORAL_TABLET | Freq: Four times a day (QID) | ORAL | Status: DC
  Administered 2024-01-16 – 2024-01-18 (×8): 600 mg via ORAL
  Filled 2024-01-16 (×8): qty 1

## 2024-01-16 MED ORDER — MIDAZOLAM HCL 2 MG/2ML IJ SOLN
INTRAMUSCULAR | Status: AC
  Administered 2024-01-16: 1 mg
  Filled 2024-01-16: qty 2

## 2024-01-16 MED ORDER — ZOLPIDEM TARTRATE 5 MG PO TABS: 5.0000 mg | ORAL_TABLET | Freq: Every evening | ORAL | Status: DC | PRN

## 2024-01-16 MED ORDER — GENTAMICIN SULFATE 40 MG/ML IJ SOLN
5.0000 mg/kg | INTRAVENOUS | Status: DC
  Administered 2024-01-16: 330 mg via INTRAVENOUS
  Filled 2024-01-16: qty 8.25

## 2024-01-16 MED ORDER — MIDAZOLAM HCL 2 MG/2ML IJ SOLN: 1.0000 mg | Freq: Once | INTRAMUSCULAR | Status: DC

## 2024-01-16 MED ORDER — DIBUCAINE (PERIANAL) 1 % EX OINT: 1.0000 | TOPICAL_OINTMENT | CUTANEOUS | Status: DC | PRN

## 2024-01-16 MED ORDER — MISOPROSTOL 100 MCG PO TABS
100.0000 ug | ORAL_TABLET | ORAL | Status: AC
  Administered 2024-01-16 – 2024-01-17 (×6): 100 ug via ORAL
  Filled 2024-01-16 (×6): qty 1

## 2024-01-16 MED ORDER — TRANEXAMIC ACID-NACL 1000-0.7 MG/100ML-% IV SOLN
INTRAVENOUS | Status: AC
  Filled 2024-01-16: qty 100

## 2024-01-16 MED ORDER — METOCLOPRAMIDE HCL 10 MG PO TABS
10.0000 mg | ORAL_TABLET | Freq: Once | ORAL | Status: AC
  Administered 2024-01-17: 10 mg via ORAL
  Filled 2024-01-16: qty 1

## 2024-01-16 MED ORDER — ONDANSETRON HCL 4 MG/2ML IJ SOLN: 4.0000 mg | INTRAMUSCULAR | Status: DC | PRN

## 2024-01-16 MED ORDER — TETANUS-DIPHTH-ACELL PERTUSSIS 5-2.5-18.5 LF-MCG/0.5 IM SUSY: 0.5000 mL | PREFILLED_SYRINGE | Freq: Once | INTRAMUSCULAR | Status: DC

## 2024-01-16 MED ORDER — CLINDAMYCIN PHOSPHATE 900 MG/50ML IV SOLN
900.0000 mg | Freq: Three times a day (TID) | INTRAVENOUS | Status: DC
  Administered 2024-01-16: 900 mg via INTRAVENOUS
  Filled 2024-01-16: qty 50

## 2024-01-16 MED ORDER — CLINDAMYCIN PHOSPHATE 900 MG/50ML IV SOLN
INTRAVENOUS | Status: AC
Start: 2024-01-16 — End: 2024-01-16
  Filled 2024-01-16: qty 50

## 2024-01-16 MED ORDER — ACETAMINOPHEN 325 MG PO TABS
650.0000 mg | ORAL_TABLET | ORAL | Status: DC | PRN
  Administered 2024-01-16 – 2024-01-18 (×9): 650 mg via ORAL
  Filled 2024-01-16 (×9): qty 2

## 2024-01-16 MED ORDER — ONDANSETRON HCL 4 MG PO TABS: 4.0000 mg | ORAL_TABLET | ORAL | Status: DC | PRN

## 2024-01-16 MED ORDER — COCONUT OIL OIL: 1.0000 | TOPICAL_OIL | Status: DC | PRN

## 2024-01-16 MED ORDER — METHYLERGONOVINE MALEATE 0.2 MG/ML IJ SOLN: 0.2000 mg | Freq: Once | INTRAMUSCULAR | Status: DC

## 2024-01-16 MED ORDER — LACTATED RINGERS IV SOLN: INTRAVENOUS | Status: AC

## 2024-01-16 MED ORDER — MISOPROSTOL 200 MCG PO TABS: 800.0000 ug | ORAL_TABLET | Freq: Once | ORAL | Status: AC

## 2024-01-16 MED ORDER — SENNOSIDES-DOCUSATE SODIUM 8.6-50 MG PO TABS
2.0000 | ORAL_TABLET | Freq: Every day | ORAL | Status: DC
  Filled 2024-01-16 (×2): qty 2

## 2024-01-16 MED ORDER — SIMETHICONE 80 MG PO CHEW
80.0000 mg | CHEWABLE_TABLET | ORAL | Status: DC | PRN
  Administered 2024-01-17: 80 mg via ORAL
  Filled 2024-01-16: qty 1

## 2024-01-16 MED ORDER — MISOPROSTOL 200 MCG PO TABS
ORAL_TABLET | ORAL | Status: AC
  Administered 2024-01-16: 800 ug via RECTAL
  Filled 2024-01-16: qty 4

## 2024-01-16 MED ORDER — OXYTOCIN 10 UNIT/ML IJ SOLN
10.0000 [IU] | Freq: Once | INTRAMUSCULAR | Status: AC
  Administered 2024-01-16: 10 [IU]

## 2024-01-16 MED ORDER — OXYCODONE HCL 5 MG PO TABS
5.0000 mg | ORAL_TABLET | ORAL | Status: DC | PRN
  Administered 2024-01-16 – 2024-01-18 (×4): 5 mg via ORAL
  Filled 2024-01-16 (×4): qty 1

## 2024-01-16 MED ORDER — DIPHENHYDRAMINE HCL 25 MG PO CAPS: 25.0000 mg | ORAL_CAPSULE | Freq: Four times a day (QID) | ORAL | Status: DC | PRN

## 2024-01-16 MED ORDER — METHYLERGONOVINE MALEATE 0.2 MG/ML IJ SOLN
INTRAMUSCULAR | Status: AC
  Administered 2024-01-16: 0.2 mg
  Filled 2024-01-16: qty 1

## 2024-01-16 MED ORDER — BENZOCAINE-MENTHOL 20-0.5 % EX AERO
1.0000 | INHALATION_SPRAY | CUTANEOUS | Status: DC | PRN
  Administered 2024-01-17: 1 via TOPICAL
  Filled 2024-01-16: qty 56

## 2024-01-16 MED ORDER — NIFEDIPINE ER OSMOTIC RELEASE 30 MG PO TB24
30.0000 mg | ORAL_TABLET | Freq: Every day | ORAL | Status: DC
  Administered 2024-01-16 – 2024-01-18 (×3): 30 mg via ORAL
  Filled 2024-01-16 (×3): qty 1

## 2024-01-16 MED ORDER — PRENATAL MULTIVITAMIN CH
1.0000 | ORAL_TABLET | Freq: Every day | ORAL | Status: DC
  Administered 2024-01-16 – 2024-01-18 (×2): 1 via ORAL
  Filled 2024-01-16 (×2): qty 1

## 2024-01-16 MED ORDER — WITCH HAZEL-GLYCERIN EX PADS: 1.0000 | MEDICATED_PAD | CUTANEOUS | Status: DC | PRN

## 2024-01-16 MED ORDER — OXYCODONE HCL 5 MG PO TABS
10.0000 mg | ORAL_TABLET | ORAL | Status: DC | PRN
  Administered 2024-01-16 – 2024-01-18 (×6): 10 mg via ORAL
  Filled 2024-01-16 (×6): qty 2

## 2024-01-16 MED ORDER — TRANEXAMIC ACID-NACL 1000-0.7 MG/100ML-% IV SOLN: 1000.0000 mg | INTRAVENOUS | Status: DC

## 2024-01-16 NOTE — Consult Note (Signed)
 ANTIBIOTIC CONSULT NOTE - INITIAL  Pharmacy Consult for Gentamicin  Indication: intrauterine vacuum   Allergies  Allergen Reactions   Penicillins Nausea Only    Has patient had a PCN reaction causing immediate rash, facial/tongue/throat swelling, SOB or lightheadedness with hypotension: No Has patient had a PCN reaction causing severe rash involving mucus membranes or skin necrosis: No Has patient had a PCN reaction that required hospitalization No Has patient had a PCN reaction occurring within the last 10 years: Yes If all of the above answers are NO, then may proceed with Cephalosporin use.     Patient Measurements: Height: 5' 2 (157.5 cm) Weight: 92.2 kg (203 lb 4.8 oz) IBW/kg (Calculated) : 50.1 Adjusted Body Weight: 66.9 kg  Vital Signs: Temp: 98.3 F (36.8 C) (09/07 2300) Temp Source: Oral (09/07 2300) BP: 138/88 (09/08 0331) Pulse Rate: 104 (09/08 0331)  Labs: Recent Labs    01/15/24 1555 01/15/24 2225  WBC 10.5 12.4*  HGB 14.3 12.7  PLT 312 257  LABCREA 182  --   CREATININE 0.58 0.57   No results for input(s): GENTTROUGH, GENTPEAK, GENTRANDOM in the last 72 hours.   Microbiology: Recent Results (from the past 720 hours)  Strep Gp B Culture+Rflx     Status: None   Collection Time: 12/28/23  1:20 PM   Specimen: Genital   VR  Result Value Ref Range Status   Strep Gp B Culture+Rflx Negative Negative Final    Comment: Centers for Disease Control and Prevention (CDC) and American Congress of Obstetricians and Gynecologists (ACOG) guidelines for prevention of perinatal group B streptococcal (GBS) disease specify co-collection of a vaginal and rectal swab specimen to maximize sensitivity of GBS detection. Per the CDC and ACOG, swabbing both the lower vagina and rectum substantially increases the yield of detection compared with sampling the vagina alone. Penicillin G, ampicillin, or cefazolin are indicated for intrapartum prophylaxis of perinatal  GBS colonization. Reflex susceptibility testing should be performed prior to use of clindamycin  only on GBS isolates from penicillin-allergic women who are considered a high risk for anaphylaxis. Treatment with vancomycin without additional testing is warranted if resistance to clindamycin  is noted.     Medications:  Clindamycin  900 mg IV q8 hours  Assessment: 33 y.o. female H6E7997 at [redacted]w[redacted]d admitted for IOL. Intrauterine vacuum was used, therefore antibiotics requested, gent, clinda.   Plan:  Gentamicin  5 mg/kg Q24 hours Check Scr with next labs if gentamicin  continued. Will check gentamicin  levels if continued > 72hr or clinically indicated.  Kimberly Farley 01/16/2024,3:46 AM

## 2024-01-16 NOTE — Discharge Summary (Signed)
 Postpartum Discharge Summary     Patient Name: Kimberly Farley DOB: 1990-09-27 MRN: [id number]  Date of admission: 01/15/2024 Delivery date:01/16/2024 Delivering provider: REGINO CREDIT A Date of discharge: 01/18/2024  Admitting diagnosis: Indication for care in labor or delivery [O75.9] Intrauterine pregnancy: [redacted]w[redacted]d     Secondary diagnosis:  Principal Problem:   Indication for care in labor or delivery Active Problems:   Chronic hypertension   Prediabetes  Additional problems: PPH, manual removal of placenta    Discharge diagnosis: Term Pregnancy Delivered, CHTN, and PPH                                              Post partum procedures:BTL Augmentation: AROM and Pitocin  Complications: None  Hospital course: Induction of Labor With Vaginal Delivery   33 y.o. yo [phone] at [redacted]w[redacted]d was admitted to the hospital 01/15/2024 for induction of labor.  Indication for induction: CHTN.  Patient had an labor course complicated by NA Membrane Rupture Time/Date: 9:12 PM,01/15/2024  Delivery Method:Vaginal, Spontaneous Operative Delivery:N/A Episiotomy: None Lacerations:  None Details of delivery can be found in separate delivery note.   Patient had a postpartum course complicated by none. Denies lightheaded/dizzy, CP, SOB, palpitations. Highest bp last 24h 127/69. Patient is discharged home 01/18/24.  Newborn Data: Birth date:01/16/2024 Birth time:1:54 AM Gender:Female Living status:Living Apgars:8 ,9  Weight:2863 g  Magnesium Sulfate received: No BMZ received: No Rhophylac:N/A MMR:N/A T-DaP:will offer PP Flu: No RSV Vaccine received: No Transfusion:No  Immunizations received: Immunization History  Administered Date(s) Administered   PFIZER(Purple Top)SARS-COV-2 Vaccination 09/20/2019, 10/15/2019   Tdap 12/24/2015    Physical exam  Vitals:   01/17/24 1525 01/17/24 1940 01/17/24 2335 01/18/24 0454  BP: (!) 110/59 119/73 105/73 114/70  Pulse: 81 84 91 87  Resp: 18 18  17 18   Temp: 97.8 F (36.6 C) 98.8 F (37.1 C) 98.7 F (37.1 C) 98.2 F (36.8 C)  TempSrc: Oral Oral Oral Oral  SpO2: 99% 98% 98% 97%  Weight:      Height:       General: alert and cooperative Lochia: appropriate Uterine Fundus: firm Incision: Dressing is clean, dry, and intact DVT Evaluation: No significant calf/ankle edema. Labs: Lab Results  Component Value Date   WBC 13.0 (H) 01/17/2024   HGB 10.8 (L) 01/17/2024   HCT 31.4 (L) 01/17/2024   MCV 92.9 01/17/2024   PLT 221 01/17/2024      Latest Ref Rng & Units 01/15/2024   10:25 PM  CMP  Glucose 70 - 99 mg/dL 96   BUN 6 - 20 mg/dL 5   Creatinine 9.55 - 8.99 mg/dL 9.42   Sodium 864 - 854 mmol/L 134   Potassium 3.5 - 5.1 mmol/L 3.9   Chloride 98 - 111 mmol/L 104   CO2 22 - 32 mmol/L 19   Calcium  8.9 - 10.3 mg/dL 9.0   Total Protein 6.5 - 8.1 g/dL 6.3   Total Bilirubin 0.0 - 1.2 mg/dL 0.5   Alkaline Phos 38 - 126 U/L 87   AST 15 - 41 U/L 23   ALT 0 - 44 U/L 16    Edinburgh Score:    01/17/2024    8:34 PM  Edinburgh Postnatal Depression Scale Screening Tool  I have been able to laugh and see the funny side of things. 0  I have looked forward with enjoyment  to things. 0  I have blamed myself unnecessarily when things went wrong. 0  I have been anxious or worried for no good reason. 0  I have felt scared or panicky for no good reason. 0  Things have been getting on top of me. 1  I have been so unhappy that I have had difficulty sleeping. 0  I have felt sad or miserable. 0  I have been so unhappy that I have been crying. 0  The thought of harming myself has occurred to me. 0  Edinburgh Postnatal Depression Scale Total 1   Edinburgh Postnatal Depression Scale Total: 1   After visit meds:  Allergies as of 01/18/2024       Reactions   Penicillins Nausea Only   Has patient had a PCN reaction causing immediate rash, facial/tongue/throat swelling, SOB or lightheadedness with hypotension: No Has patient had a PCN  reaction causing severe rash involving mucus membranes or skin necrosis: No Has patient had a PCN reaction that required hospitalization No Has patient had a PCN reaction occurring within the last 10 years: Yes If all of the above answers are NO, then may proceed with Cephalosporin use.        Medication List     STOP taking these medications    aspirin  EC 81 MG tablet       TAKE these medications    acetaminophen  325 MG tablet Commonly known as: Tylenol  Take 2 tablets (650 mg total) by mouth every 4 (four) hours as needed for moderate pain (pain score 4-6).   Blood Pressure Kit Devi 1 Device by Does not apply route daily. To monitor BP at home weekly icd 10 dx: o09.90   Gojji Weight Scale Misc 1 Device by Does not apply route daily. To monitor weight at home weekly icd 10 dx: o09.90   ibuprofen  600 MG tablet Commonly known as: ADVIL  Take 1 tablet (600 mg total) by mouth every 6 (six) hours.   NIFEdipine  30 MG 24 hr tablet Commonly known as: ADALAT  CC Take 1 tablet (30 mg total) by mouth daily.   oxyCODONE  5 MG immediate release tablet Commonly known as: Oxy IR/ROXICODONE  Take 1 tablet (5 mg total) by mouth every 4 (four) hours as needed for severe pain (pain score 7-10).   Prenatal Plus Vitamin/Mineral 27-1 MG Tabs Take 1 tablet by mouth daily.   senna-docusate 8.6-50 MG tablet Commonly known as: Senokot-S Take 2 tablets by mouth daily.         Discharge home in stable condition Infant Feeding: Bottle and Breast Infant Disposition:home with mother Discharge instruction: per After Visit Summary and Postpartum booklet. Activity: Advance as tolerated. Pelvic rest for 6 weeks.  Diet: routine diet Future Appointments: Future Appointments  Date Time Provider Department Center  01/23/2024  2:30 PM [facility] NURSE [facility]  03/06/2024  2:55 PM Warren-Hill, Camie LABOR, CNM [facility]   Follow up Visit:  Message sent to [facility] 01/16/24 by [facility], CNM Please  schedule this patient for a In person postpartum visit in 6 weeks with the following provider: Camie Rote, if possible. Additional Postpartum F/U:BP check 1 week  High risk pregnancy complicated by: HTN Delivery mode:  Vaginal, Spontaneous Anticipated Birth Control:  BTL   01/18/2024 Barabara Maier, DO

## 2024-01-16 NOTE — Progress Notes (Signed)
 Jada briefly without suction per RN and bleeding was noted to increase. Suction turned back on. CNM to bedside to assess prior to transfer to [facility]. Agree patient stable for transfer. Will plan DC suction of Jada again in 4 hours and follow clinically.  Camie Rote, MSN, CNM, RNC-OB Certified Nurse Midwife, [facility] 01/16/2024 6:08 AM

## 2024-01-16 NOTE — Anesthesia Preprocedure Evaluation (Signed)
 Anesthesia Evaluation  Patient identified by MRN, date of birth, ID band Patient awake    Reviewed: Allergy & Precautions, NPO status , Patient's Chart, lab work & pertinent test results  History of Anesthesia Complications Negative for: history of anesthetic complications  Airway Mallampati: II  TM Distance: >3 FB Neck ROM: Full    Dental  (+) Dental Advisory Given   Pulmonary neg pulmonary ROS   Pulmonary exam normal breath sounds clear to auscultation       Cardiovascular hypertension (nifedipine ), Pt. on medications (-) angina (-) Past MI, (-) Cardiac Stents and (-) CABG (-) dysrhythmias  Rhythm:Regular Rate:Normal     Neuro/Psych negative neurological ROS     GI/Hepatic negative GI ROS, Neg liver ROS,,,  Endo/Other  Pre-diabetes  Renal/GU negative Renal ROS     Musculoskeletal   Abdominal  (+) + obese  Peds  Hematology  (+) Blood dyscrasia, anemia Lab Results      Component                Value               Date                      WBC                      13.0 (H)            01/17/2024                HGB                      10.8 (L)            01/17/2024                HCT                      31.4 (L)            01/17/2024                MCV                      92.9                01/17/2024                PLT                      221                 01/17/2024              Anesthesia Other Findings   Reproductive/Obstetrics (+) Breast feeding                               Anesthesia Physical Anesthesia Plan  ASA: 2  Anesthesia Plan: Spinal   Post-op Pain Management:    Induction:   PONV Risk Score and Plan: 2 and Ondansetron , Dexamethasone and Treatment may vary due to age or medical condition  Airway Management Planned: Natural Airway  Additional Equipment:   Intra-op Plan:   Post-operative Plan:   Informed Consent: I have reviewed the patients History  and Physical, chart, labs and discussed the procedure including the risks, benefits and alternatives for  the proposed anesthesia with the patient or authorized representative who has indicated his/her understanding and acceptance.       Plan Discussed with: CRNA and Anesthesiologist  Anesthesia Plan Comments: (I have discussed risks of neuraxial anesthesia including but not limited to infection, bleeding, nerve injury, back pain, headache, seizures, and failure of block. Patient denies bleeding disorders and is not currently anticoagulated. Labs have been reviewed. Risks and benefits discussed. All patient's questions answered.  )         Anesthesia Quick Evaluation

## 2024-01-16 NOTE — Progress Notes (Addendum)
 CNM at bedside for observation of multiparous laboring patient. Patient not reporting urge to push. Coping well with breath, rhythm, vocalization and nitrous. Hands and knees, supported by peanut ball.   Physiologic labor noted: purple natal cleft consistent with 8-10cm dilation, Rhombus of Michaelis prominent.  No signs of imminent delivery, will continue to observe PRN. Differ SCE for now due to noted external signs of progress in labor.   FHR tracing Cat 2, reassuring overall Baseline 145 Moderate variability 15x15 acceleration Variable decelerations noted Ctx: 3-5, lasting 80-120, palpate strong  - HA previously reported improved with Tylenol . - PEC labs benign.   Camie Rote, MSN, CNM, RNC-OB Certified Nurse Midwife, [facility] 01/16/2024 1:21 AM

## 2024-01-16 NOTE — Progress Notes (Signed)
 33 yo g3p3 s/p nsvd this morning complicated by PPH EBL ~1150, also cHTN. Treated with uterotonics and jada device. Jada in for about 6 hours. Return now is scant. It has been removed from suction and the cuff deflated for 30 minutes, no significant bleeding and fundus firm, kari now removed by me per manufacturer's specifications, bleeding remains appropriate and fundus firm, will continue the previously-ordered cytotec  for 24 hours, monitor closely.

## 2024-01-16 NOTE — Lactation Note (Signed)
 This note was copied from a baby's chart. Lactation Consultation Note  Patient Name: Kimberly Farley Unijb'd Date: 01/16/2024 Age:31 hours Reason for consult: Initial assessment;Term  P3, 39 wks, @ 6 hrs of life. Mom chooses to pump only on L&D. Per mom  she waits to go home to pump. Encouraged mom breasts do respond better to hormones of milk production with each pregnancy, and she may notice milk coming in as early as discharge day. Highlighted breast stimulation still key to production in early days- offered DEBP set-up, mom not receptive @ this time. Encouraged her to ask RN or LC anytime she desired DEBP. Hand pump provided with flange variety, discussed sizing only to nipple and dynamic with breast changes. Discussed stomach size on first day. LC services and milk storage shared.      Maternal Data Does the patient have breastfeeding experience prior to this delivery?: Yes How long did the patient breastfeed?: Mom prefers pumping - previous children 8 & 2.  Feeding Mother's Current Feeding Choice: Breast Milk and Formula Nipple Type: Slow - flow  Lactation Tools Discussed/Used Tools: Pump;Flanges Breast pump type: Manual (Offered DEBP set up, Per mom- experienced mom whose milk comes in @ home, she waits to pump till @ home-) Pump Education: Milk Storage;Setup, frequency, and cleaning  Interventions Interventions: Expressed milk;Hand pump;DEBP;Education;LC Services brochure;CDC milk storage guidelines  Discharge Pump: DEBP;Manual;Personal  Consult Status Consult Status: Follow-up Date: 01/17/24 Follow-up type: In-patient    [facility] 01/16/2024, 8:37 AM

## 2024-01-17 ENCOUNTER — Other Ambulatory Visit: Payer: Self-pay

## 2024-01-17 ENCOUNTER — Inpatient Hospital Stay (HOSPITAL_COMMUNITY): Payer: Self-pay | Admitting: Anesthesiology

## 2024-01-17 ENCOUNTER — Encounter: Admitting: Obstetrics & Gynecology

## 2024-01-17 ENCOUNTER — Encounter (HOSPITAL_COMMUNITY): Payer: Self-pay | Admitting: Family Medicine

## 2024-01-17 ENCOUNTER — Encounter (HOSPITAL_COMMUNITY)
Admission: RE | Disposition: A | Discharge status: Home / Self Care | Payer: Self-pay | Source: Home / Self Care | Attending: Family Medicine

## 2024-01-17 DIAGNOSIS — Z302 Encounter for sterilization: Secondary | ICD-10-CM

## 2024-01-17 HISTORY — PX: TUBAL LIGATION: SHX77

## 2024-01-17 LAB — CBC
HCT: 31.4 % — ABNORMAL LOW (ref 36.0–46.0)
Hemoglobin: 10.8 g/dL — ABNORMAL LOW (ref 12.0–15.0)
MCH: 32 pg (ref 26.0–34.0)
MCHC: 34.4 g/dL (ref 30.0–36.0)
MCV: 92.9 fL (ref 80.0–100.0)
Platelets: 221 K/uL (ref 150–400)
RBC: 3.38 MIL/uL — ABNORMAL LOW (ref 3.87–5.11)
RDW: 12.8 % (ref 11.5–15.5)
WBC: 13 K/uL — ABNORMAL HIGH (ref 4.0–10.5)
nRBC: 0 % (ref 0.0–0.2)

## 2024-01-17 SURGERY — LIGATION, FALLOPIAN TUBE, POSTPARTUM
Anesthesia: Spinal | Laterality: Bilateral

## 2024-01-17 MED ORDER — FENTANYL CITRATE (PF) 100 MCG/2ML IJ SOLN
INTRAMUSCULAR | Status: DC | PRN
  Administered 2024-01-17: 50 ug via INTRAVENOUS

## 2024-01-17 MED ORDER — DEXMEDETOMIDINE HCL IN NACL 80 MCG/20ML IV SOLN
INTRAVENOUS | Status: DC | PRN
  Administered 2024-01-17 (×3): 8 ug via INTRAVENOUS

## 2024-01-17 MED ORDER — ACETAMINOPHEN 10 MG/ML IV SOLN: INTRAVENOUS | Status: DC | PRN

## 2024-01-17 MED ORDER — BUPIVACAINE IN DEXTROSE 0.75-8.25 % IT SOLN
INTRATHECAL | Status: DC | PRN
  Administered 2024-01-17: 1.6 mL via INTRATHECAL

## 2024-01-17 MED ORDER — SODIUM CHLORIDE 0.9% FLUSH: 3.0000 mL | INTRAVENOUS | Status: DC | PRN

## 2024-01-17 MED ORDER — FENTANYL CITRATE (PF) 100 MCG/2ML IJ SOLN
INTRAMUSCULAR | Status: DC | PRN
  Administered 2024-01-17: 15 ug via INTRATHECAL

## 2024-01-17 MED ORDER — FENTANYL CITRATE (PF) 100 MCG/2ML IJ SOLN
INTRAMUSCULAR | Status: AC
  Filled 2024-01-17: qty 2

## 2024-01-17 MED ORDER — OXYCODONE HCL 5 MG PO TABS: 5.0000 mg | ORAL_TABLET | Freq: Once | ORAL | Status: DC | PRN

## 2024-01-17 MED ORDER — POLYSACCHARIDE IRON COMPLEX 150 MG PO CAPS
150.0000 mg | ORAL_CAPSULE | Freq: Every day | ORAL | Status: DC
  Administered 2024-01-18: 150 mg via ORAL
  Filled 2024-01-17: qty 1

## 2024-01-17 MED ORDER — PROMETHAZINE (PHENERGAN) 6.25MG IN NS 50ML IVPB: 6.2500 mg | INTRAVENOUS | Status: DC | PRN

## 2024-01-17 MED ORDER — NALOXONE HCL 0.4 MG/ML IJ SOLN: 0.4000 mg | INTRAMUSCULAR | Status: DC | PRN

## 2024-01-17 MED ORDER — ONDANSETRON HCL 4 MG/2ML IJ SOLN
INTRAMUSCULAR | Status: DC | PRN
Start: 2024-01-17 — End: 2024-01-17
  Administered 2024-01-17: 4 mg via INTRAVENOUS

## 2024-01-17 MED ORDER — DEXMEDETOMIDINE HCL IN NACL 80 MCG/20ML IV SOLN
INTRAVENOUS | Status: AC
  Filled 2024-01-17: qty 20

## 2024-01-17 MED ORDER — FENTANYL CITRATE (PF) 100 MCG/2ML IJ SOLN: 25.0000 ug | INTRAMUSCULAR | Status: DC | PRN

## 2024-01-17 MED ORDER — LACTATED RINGERS IV SOLN: INTRAVENOUS | Status: DC | PRN

## 2024-01-17 MED ORDER — OXYCODONE HCL 5 MG/5ML PO SOLN: 5.0000 mg | Freq: Once | ORAL | Status: DC | PRN

## 2024-01-17 MED ORDER — ACETAMINOPHEN 10 MG/ML IV SOLN
INTRAVENOUS | Status: AC
Start: 2024-01-17 — End: 2024-01-17
  Filled 2024-01-17: qty 100

## 2024-01-17 SURGICAL SUPPLY — 20 items
BLADE SURG 11 STRL SS (BLADE) ×1 IMPLANT
DISSECTOR SURG LIGASURE 21 (MISCELLANEOUS) IMPLANT
DRSG OPSITE POSTOP 3X4 (GAUZE/BANDAGES/DRESSINGS) ×1 IMPLANT
DURAPREP 26ML APPLICATOR (WOUND CARE) ×1 IMPLANT
GLOVE BIOGEL PI IND STRL 7.0 (GLOVE) ×1 IMPLANT
GLOVE BIOGEL PI IND STRL 7.5 (GLOVE) ×1 IMPLANT
GLOVE ECLIPSE 7.5 STRL STRAW (GLOVE) ×1 IMPLANT
GOWN STRL REUS W/TWL LRG LVL3 (GOWN DISPOSABLE) ×2 IMPLANT
HIBICLENS CHG 4% 4OZ BTL (MISCELLANEOUS) ×1 IMPLANT
NEEDLE HYPO 22GX1.5 SAFETY (NEEDLE) ×1 IMPLANT
NS IRRIG 1000ML POUR BTL (IV SOLUTION) ×1 IMPLANT
PACK ABDOMINAL MINOR (CUSTOM PROCEDURE TRAY) ×1 IMPLANT
PROTECTOR NERVE ULNAR (MISCELLANEOUS) ×1 IMPLANT
SPONGE LAP 4X18 RFD (DISPOSABLE) IMPLANT
SUT PLAIN ABS 2-0 CT1 27XMFL (SUTURE) IMPLANT
SUT VICRYL 0 UR6 27IN ABS (SUTURE) ×1 IMPLANT
SUT VICRYL 4-0 PS2 18IN ABS (SUTURE) ×1 IMPLANT
SYR CONTROL 10ML LL (SYRINGE) ×1 IMPLANT
TOWEL OR 17X24 6PK STRL BLUE (TOWEL DISPOSABLE) ×2 IMPLANT
TRAY FOLEY W/BAG SLVR 14FR (SET/KITS/TRAYS/PACK) ×1 IMPLANT

## 2024-01-17 NOTE — Progress Notes (Signed)
 POSTPARTUM PROGRESS NOTE  Subjective: Kimberly Farley is a 33 y.o. [id number] s/p SVB at [redacted]w[redacted]d.  She reports she doing well. No acute events overnight. She denies any problems with ambulating, voiding or po intake. Denies nausea or vomiting. She has  passed flatus. Pain is well controlled.  Lochia is appropriate.  Objective: Blood pressure 116/73, pulse 87, temperature 98 F (36.7 C), temperature source Oral, resp. rate 18, height 5' 2 (1.575 m), weight 92.2 kg, last menstrual period 04/18/2023, SpO2 100%, unknown if currently breastfeeding.  Physical Exam:  General: alert, cooperative and no distress Chest: no respiratory distress Abdomen: soft, non-tender  Uterine Fundus: firm, appropriately tender Extremities: No calf swelling or tenderness  trace edema  Recent Labs    01/16/24 1000 01/17/24 0408  HGB 12.2 10.8*  HCT 35.8* 31.4*    Assessment/Plan: Kimberly Farley is a 33 y.o. [id number] s/p SVB at [redacted]w[redacted]d  Routine Postpartum Care: Doing well, pain well-controlled.  -- Continue routine care, lactation support  -- Contraception: BTL - scheduled today -- Feeding: breast -- PPH - PO iron .   Dispo: Plan for discharge 01/18/2024.  Camie Rote, MSN, CNM, RNC-OB Certified Nurse Midwife, [facility] 01/17/2024 5:17 AM

## 2024-01-17 NOTE — Anesthesia Procedure Notes (Signed)
 Spinal  Patient location during procedure: OR Start time: 01/17/2024 9:42 AM End time: 01/17/2024 9:44 AM Reason for block: surgical anesthesia Staffing Performed: anesthesiologist  Anesthesiologist: Peggye Delon Brunswick, MD Performed by: Peggye Delon Brunswick, MD Authorized by: Peggye Delon Brunswick, MD   Preanesthetic Checklist Completed: patient identified, IV checked, site marked, risks and benefits discussed, surgical consent, monitors and equipment checked, pre-op evaluation and timeout performed Spinal Block Patient position: sitting Prep: DuraPrep Patient monitoring: blood pressure and continuous pulse ox Approach: midline Location: L3-4 Injection technique: single-shot Needle Needle type: Pencan  Needle gauge: 24 G Needle length: 9 cm Assessment Sensory level: T4 Additional Notes Risks and benefits of neuraxial anesthesia including, but not limited to, infection, bleeding, local anesthetic toxicity, headache, hypotension, back pain, block failure, etc. were discussed with the patient. The patient expressed understanding and consented to the procedure. I confirmed that the patient has no bleeding disorders and is not taking blood thinners. I confirmed the patient's last platelet count with the nurse. Monitors were applied. A time-out was performed immediately prior to the procedure. Sterile technique was used throughout the whole procedure.   1 attempt(s)

## 2024-01-17 NOTE — Lactation Note (Signed)
 This note was copied from a baby's chart. Lactation Consultation Note  Patient Name: Kimberly Farley Date: 01/17/2024 Age:31 hours Reason for consult: Follow-up assessment;Term;Exclusive pumping and bottle feeding  MOB informed LC she plans to pump at home after her hospital discharge this is her feeding choice.LC discussed early stimulation with pumping would help with stimulation and establishing her milk supply. MOB declined using the DEBP and MOB has manual hand pump in room. Per MOB, she did this with her other two previous children. She pumped after hospital discharge at home and when returning to work. LC reviewed  Storage and Preparation of Breast Milk.    Maternal Data    Feeding Mother's Current Feeding Choice: Formula Nipple Type: Slow - flow  LATCH Score                    Lactation Tools Discussed/Used    Interventions Interventions: CDC Guidelines for Breast Pump Cleaning;CDC milk storage guidelines;Guidelines for Milk Supply and Pumping Schedule Handout;DEBP  Discharge Pump: DEBP;Personal  Consult Status Consult Status: Follow-up Date: 01/18/24 Follow-up type: In-patient    Kimberly Farley 01/17/2024, 6:21 PM

## 2024-01-17 NOTE — Transfer of Care (Signed)
 Immediate Anesthesia Transfer of Care Note  Patient: Kimberly Farley  Procedure(s) Performed: LIGATION, FALLOPIAN TUBE, POSTPARTUM (Bilateral)  Patient Location: PACU  Anesthesia Type:Spinal  Level of Consciousness: awake, alert , and oriented  Airway & Oxygen Therapy: Patient Spontanous Breathing  Post-op Assessment: Report given to RN and Post -op Vital signs reviewed and stable  Post vital signs: Reviewed and stable  Last Vitals:  Vitals Value Taken Time  BP 95/55 01/17/24 10:49  Temp    Pulse 86 01/17/24 10:52  Resp 14 01/17/24 10:52  SpO2 88 % 01/17/24 10:52  Vitals shown include unfiled device data.  Last Pain:  Vitals:   01/17/24 0915  TempSrc:   PainSc: 2          Complications: No notable events documented.

## 2024-01-17 NOTE — Op Note (Signed)
 Postpartum Tubal Ligation Operative Note   Patient: Kimberly Farley  Date of Procedure: 01/17/2024  Procedure: Postpartum bilateral Tubal Ligation via Bilateral salpingectomy   Indications: undesired fertility  Pre-operative Diagnosis: patient desires sterilization.   Post-operative Diagnosis: Same  Surgeon: Surgeons and Role:    * Ilean Norleen GAILS, MD - Primary  Assistants: Leeroy Pouch, MD  An experienced assistant was required given the standard of surgical care given the complexity of the case.  This assistant was needed for exposure, dissection, suctioning, retraction, instrument exchange, assisting with delivery with administration of fundal pressure, and for overall help during the procedure.   Anesthesia: spinal  Anesthesiologist: Peggye Delon Brunswick, MD   Antibiotics: None   Estimated Blood Loss: 6 ml   Total IV Fluids: 800 ml  Urine Output: Foley catheter to be placed post-op  Specimens: bilateral fallopian tubes   Complications: no complications   Indications: Kimberly Farley is a 33 y.o. [phone] with undesired fertility, status post vaginal delivery, desires permanent sterilization.  Other reversible forms of contraception were discussed with patient; she declines all other modalities. Risks of procedure discussed with patient including but not limited to: risk of regret, permanence of method, bleeding, infection, injury to surrounding organs and need for additional procedures.  Failure risk of 1-2 % with increased risk of ectopic gestation if pregnancy occurs and possibility of post-tubal pain syndrome also discussed with patient.  Findings: Normal uterus, Normal bilateral fallopian tubes, Normal bilateral ovaries.  Procedure Details: The patient was taken to the operating room where spinal anesthesia was dosed and found to be adequate.  She was then placed in the dorsal supine position and prepped and draped in sterile fashion.  After an adequate  timeout was performed, attention was turned to the patient's abdomen where a small transverse skin incision was made under the umbilical fold. The incision was taken down to the layer of fascia using the scalpel, and fascia was incised, and the incision was extended bilaterally. The peritoneum was entered in a sharp fashion.   Attention was then turned to the fallopian tubes.The left fallopian tube was identified and followed out to the fimbriated end.  Ligasure device was used to cauterize and cut the mesosalpinx to proximal end of the fallopian tube. A similar process was carried out on the right side allowing for bilateral tubal sterilization.    Good hemostasis was noted overall. All laps and instruments were then removed from the patient's abdomen and the fascial incision was repaired with 0 Vicryl. The subcutaneous tissue was reapproximated. The skin was closed with a 4-0 Monocryl subcuticular stitch. The patient tolerated the procedure well.  Instrument, sponge, and needle counts were correct times three.  The patient was then taken to the recovery room awake and in stable condition.  Disposition: PACU - hemodynamically stable.    Signed: Leeroy KATHEE Pouch, MD Attending Family Medicine Physician, [facility] for [facility], [facility]

## 2024-01-17 NOTE — Anesthesia Postprocedure Evaluation (Signed)
 Anesthesia Post Note  Patient: Kimberly Farley  Procedure(s) Performed: LIGATION, FALLOPIAN TUBE, POSTPARTUM (Bilateral)     Patient location during evaluation: PACU Anesthesia Type: Spinal Level of consciousness: awake Pain management: pain level controlled Vital Signs Assessment: post-procedure vital signs reviewed and stable Respiratory status: spontaneous breathing, respiratory function stable and nonlabored ventilation Cardiovascular status: blood pressure returned to baseline and stable Postop Assessment: no headache, no backache and no apparent nausea or vomiting Anesthetic complications: no   No notable events documented.  Last Vitals:  Vitals:   01/17/24 1330 01/17/24 1430  BP: 116/78 127/69  Pulse: 91 (!) 105  Resp: 18 18  Temp: 37.1 C 36.8 C  SpO2: 98% 98%    Last Pain:  Vitals:   01/17/24 1430  TempSrc: Oral  PainSc: 3    Pain Goal:                   Delon Aisha Arch

## 2024-01-18 ENCOUNTER — Other Ambulatory Visit: Payer: Self-pay

## 2024-01-18 ENCOUNTER — Other Ambulatory Visit (HOSPITAL_COMMUNITY): Payer: Self-pay

## 2024-01-18 LAB — SURGICAL PATHOLOGY

## 2024-01-18 MED ORDER — ACETAMINOPHEN 325 MG PO TABS
650.0000 mg | ORAL_TABLET | ORAL | 0 refills | Status: AC | PRN
  Filled 2024-01-18: qty 30, 3d supply, fill #0

## 2024-01-18 MED ORDER — OXYCODONE HCL 5 MG PO TABS
5.0000 mg | ORAL_TABLET | ORAL | 0 refills | Status: AC | PRN
  Filled 2024-01-18: qty 12, 2d supply, fill #0

## 2024-01-18 MED ORDER — SENNOSIDES-DOCUSATE SODIUM 8.6-50 MG PO TABS
2.0000 | ORAL_TABLET | Freq: Every day | ORAL | 0 refills | Status: AC
  Filled 2024-01-18: qty 30, 15d supply, fill #0

## 2024-01-18 MED ORDER — IBUPROFEN 600 MG PO TABS
600.0000 mg | ORAL_TABLET | Freq: Four times a day (QID) | ORAL | 0 refills | Status: AC
  Filled 2024-01-18: qty 30, 8d supply, fill #0

## 2024-01-18 NOTE — Lactation Note (Signed)
 This note was copied from a baby's chart. Lactation Consultation Note  Patient Name: Kimberly Farley Unijb'd Date: 01/18/2024 Age:31 hours Reason for consult: Follow-up assessment;Exclusive pumping and bottle feeding;Term (PPH)  P3- MOB's plan is to exclusively pump only (and offer formula until her milk comes in.) MOB has not been pumping while admitted to the hospital. MOB reports that she waits until she goes home and then she starts pumping. MOB reports doing this with all of her children. MOB reports that her breasts are starting to fill, but they are not engorged yet. LC offered to flange size MOB, but she declined. LC provided MOB with more wipes, diapers and formula per MOB request. LC reviewed engorgement/breast care and encouraged MOB to call for further assistance as needed.  Maternal Data Has patient been taught Hand Expression?: No Does the patient have breastfeeding experience prior to this delivery?: Yes  Feeding Mother's Current Feeding Choice: Breast Milk and Formula Nipple Type: Slow - flow  Interventions Interventions: Breast feeding basics reviewed;Education;LC Services brochure  Discharge Discharge Education: Engorgement and breast care;Warning signs for feeding baby Pump: DEBP;Manual;Personal  Consult Status Consult Status: Complete Date: 01/18/24    Recardo Hoit BS, IBCLC 01/18/2024, 8:31 AM

## 2024-01-18 NOTE — Patient Instructions (Signed)
 If interested in an outpatient lactation consult in office or virtually please reach out to us  at [facility] (First Floor) [address], [place]  Please call [phone] and press [phone] for lactation.    Lactation support groups:  [facility], Tuesdays 10:00 am -12:00 pm at [address] on the second floor in the conference room, lactating parents and lap babies welcome.  [email or web address]  [email or web address]   Kimberly Farley, [facility] for [facility]

## 2024-01-19 ENCOUNTER — Ambulatory Visit: Payer: Self-pay | Admitting: Certified Nurse Midwife

## 2024-01-19 ENCOUNTER — Ambulatory Visit

## 2024-01-19 LAB — SURGICAL PATHOLOGY

## 2024-01-20 ENCOUNTER — Encounter: Payer: Self-pay | Admitting: Certified Nurse Midwife

## 2024-01-20 ENCOUNTER — Encounter: Admitting: Obstetrics and Gynecology

## 2024-01-20 ENCOUNTER — Inpatient Hospital Stay (HOSPITAL_COMMUNITY)

## 2024-01-23 ENCOUNTER — Ambulatory Visit

## 2024-01-27 ENCOUNTER — Encounter: Admitting: Family Medicine

## 2024-01-27 ENCOUNTER — Telehealth (HOSPITAL_COMMUNITY): Payer: Self-pay | Admitting: *Deleted

## 2024-01-27 NOTE — Telephone Encounter (Signed)
 01/27/2024  Name: [name] MRN: [id number] DOB: 1991/02/10  Reason for Call:  Transition of Care Hospital Discharge Call  Contact Status: Patient Contact Status: Message  Language assistant needed:          Follow-Up Questions:    Van Postnatal Depression Scale:  In the Past 7 Days:    PHQ2-9 Depression Scale:     Discharge Follow-up:    Post-discharge interventions: NA  Mliss Sieve, RN 01/27/2024 14:14

## 2024-01-31 DIAGNOSIS — Z0289 Encounter for other administrative examinations: Secondary | ICD-10-CM

## 2024-02-01 ENCOUNTER — Telehealth: Payer: Self-pay

## 2024-02-01 NOTE — Telephone Encounter (Signed)
 RN called pt to clarify needs to be addressed on ADA forms she submitted.  Pt states she isn't eligible for typical FMLA at her job yet so she was given ADA forms for work absence after delivery.  Pt request papers address 6 weeks postpartum recovery after vaginal delivery 01/16/24.  Advised pt there are two pages that require her completion and signature.  Pt states she plans to come by office 02/02/24 to pay paperwork fee, complete ROI form and her part of submitted ADA paperwork.  She request paperwork be faxed to [phone].  Pt verbalized understanding of process and had no further questions at this time.   [place], RN

## 2024-02-03 ENCOUNTER — Encounter: Admitting: Family Medicine

## 2024-02-13 DIAGNOSIS — Z0289 Encounter for other administrative examinations: Secondary | ICD-10-CM

## 2024-02-17 NOTE — Progress Notes (Signed)
 Patient ID: Kimberly Farley, female   DOB: Oct 08, 1990, 33 y.o.   MRN: [id number] Opened in error. See NST appt for documentation

## 2024-02-29 ENCOUNTER — Ambulatory Visit: Admitting: Family Medicine

## 2024-03-06 ENCOUNTER — Ambulatory Visit: Payer: Self-pay | Admitting: Certified Nurse Midwife

## 2024-03-08 ENCOUNTER — Other Ambulatory Visit (HOSPITAL_COMMUNITY): Payer: Self-pay

## 2024-03-08 MED ORDER — FLUZONE 0.5 ML IM SUSY
0.5000 mL | PREFILLED_SYRINGE | Freq: Once | INTRAMUSCULAR | 0 refills | Status: AC | PRN
  Filled 2024-03-08: qty 0.5, 1d supply, fill #0

## 2024-03-13 ENCOUNTER — Other Ambulatory Visit: Payer: Self-pay

## 2024-03-13 ENCOUNTER — Ambulatory Visit: Admitting: Certified Nurse Midwife

## 2024-03-13 ENCOUNTER — Encounter: Payer: Self-pay | Admitting: Certified Nurse Midwife

## 2024-03-13 DIAGNOSIS — F53 Postpartum depression: Secondary | ICD-10-CM

## 2024-03-13 DIAGNOSIS — I10 Essential (primary) hypertension: Secondary | ICD-10-CM | POA: Diagnosis not present

## 2024-03-13 MED ORDER — AMLODIPINE BESYLATE 5 MG PO TABS: 5.0000 mg | ORAL_TABLET | Freq: Every day | ORAL | 3 refills | Status: AC

## 2024-03-13 MED ORDER — ESCITALOPRAM OXALATE 5 MG PO TABS: 5.0000 mg | ORAL_TABLET | Freq: Every day | ORAL | 1 refills | Status: DC

## 2024-03-13 NOTE — Progress Notes (Unsigned)
 Post Partum Visit Note  Kimberly Farley is a 33 y.o. G14P3003 female who presents for a postpartum visit. She is 8.1 weeks postpartum following a normal spontaneous vaginal delivery.  I have fully reviewed the prenatal and intrapartum course. The delivery was at 39 gestational weeks.  Anesthesia: none. Postpartum course has been good. Baby is doing well. Baby is feeding by breast. Bleeding no bleeding. Bowel function is normal. Bladder function is normal. Patient is not sexually active. Contraception method is tubal ligation. Postpartum depression screening: negative.   The pregnancy intention screening data noted above was reviewed. Potential methods of contraception were discussed. The patient elected to proceed with No data recorded.   Edinburgh Postnatal Depression Scale - 03/13/24 1709       Edinburgh Postnatal Depression Scale:  In the Past 7 Days   I have been able to laugh and see the funny side of things. 0    I have looked forward with enjoyment to things. 0    I have blamed myself unnecessarily when things went wrong. 2    I have been anxious or worried for no good reason. 0    I have felt scared or panicky for no good reason. 1    Things have been getting on top of me. 2    I have been so unhappy that I have had difficulty sleeping. 1    I have felt sad or miserable. 1    I have been so unhappy that I have been crying. 1    The thought of harming myself has occurred to me. 0    Edinburgh Postnatal Depression Scale Total 8          Health Maintenance Due  Topic Date Due   Hepatitis B Vaccines 19-59 Average Risk (1 of 3 - 19+ 3-dose series) Never done   HPV VACCINES (1 - 3-dose SCDM series) Never done   COVID-19 Vaccine (3 - 2025-26 season) 01/09/2024    {Common ambulatory SmartLinks:19316}  Review of Systems {ros; complete:30496}  Objective:  BP 137/89   Pulse 83   Wt 187 lb 6.4 oz (85 kg)   LMP 04/18/2023   Breastfeeding Yes   BMI 34.28 kg/m     General:  {gen appearance:16600}   Breasts:  {desc; normal/abnormal/not indicated:14647}  Lungs: {lung exam:16931}  Heart:  {heart exam:5510}  Abdomen: {abdomen exam:16834}   Wound {Wound assessment:11097}  GU exam:  {desc; normal/abnormal/not indicated:14647}       Assessment:    1. Postpartum care and examination ***  2. Chronic hypertension ***   *** postpartum exam.   Plan:   Essential components of care per ACOG recommendations:  1.  Mood and well being: Patient with {gen negative/positive:315881} depression screening today. Reviewed local resources for support.  - Patient tobacco use? {tobacco use:25506}  - hx of drug use? {yes/no:25505}    2. Infant care and feeding:  -Patient currently breastmilk feeding? {yes/no:25502}  -Social determinants of health (SDOH) reviewed in EPIC. No concerns***The following needs were identified***  3. Sexuality, contraception and birth spacing - Patient {DOES_DOES WNU:81435} want a pregnancy in the next year.  Desired family size is {NUMBER 1-10:22536} children.  - Reviewed reproductive life planning. Reviewed contraceptive methods based on pt preferences and effectiveness.  Patient desired {Upstream End Methods:24109} today.   - Discussed birth spacing of 18 months  4. Sleep and fatigue -Encouraged family/partner/community support of 4 hrs of uninterrupted sleep to help with mood and fatigue  5. Physical  Recovery  - Discussed patients delivery and complications. She describes her labor as {description:25511} - Patient had a {CHL AMB DELIVERY:[phone]}. Patient had a {laceration:25518} laceration. Perineal healing reviewed. Patient expressed understanding - Patient has urinary incontinence? {yes/no:25515} - Patient {ACTION; IS/IS WNU:[phone]} safe to resume physical and sexual activity  6.  Health Maintenance - HM due items addressed {Yes or If no, why not?:20788} - Last pap smear  Diagnosis  Date Value Ref Range Status   07/13/2023   Final   - Negative for intraepithelial lesion or malignancy (NILM)   Pap smear {done:10129} at today's visit.  -Breast Cancer screening indicated? {indicated:25516}  7. Chronic Disease/Pregnancy Condition follow up: {Follow up:25499}  - PCP follow up  Camie DELENA Rote, CNM [facility], [facility]

## 2024-03-14 ENCOUNTER — Telehealth: Payer: Self-pay | Admitting: Clinical

## 2024-03-14 NOTE — Telephone Encounter (Signed)
Attempt call regarding referral; Left HIPPA-compliant message to call back Mikle Sternberg from [facility] at  [phone] (Ashelyn Mccravy's office).    

## 2024-03-30 NOTE — BH Specialist Note (Deleted)
 Integrated Behavioral Health via Telemedicine Visit  03/30/2024 [name] [id number]  Number of Integrated Behavioral Health Clinician visits: No data recorded Session Start time: No data recorded  Session End time: No data recorded Total time in minutes: No data recorded  Referring Provider: Camie Rote, CNM Patient/Family location: Home*** [facility] Provider location: [facility]  All persons participating in visit: Patient Kimberly Farley and [facility] Saverio Kader ***  Types of Service: {CHL AMB TYPE OF [phone]}  I connected with Charmaine Handler and/or Charmaine Lofgren's {family members:20773} via  Telephone or Engineer, Civil (consulting)  (Video is Caregility application) and verified that I am speaking with the correct person using two identifiers. Discussed confidentiality: Yes   I discussed the limitations of telemedicine and the availability of in person appointments.  Discussed there is a possibility of technology failure and discussed alternative modes of communication if that failure occurs.  I discussed that engaging in this telemedicine visit, they consent to the provision of behavioral healthcare and the services will be billed under their insurance.  Patient and/or legal guardian expressed understanding and consented to Telemedicine visit: Yes   Presenting Concerns: Patient and/or family reports the following symptoms/concerns: *** Duration of problem: ***; Severity of problem: {Mild/Moderate/Severe:20260}  Patient and/or Family's Strengths/Protective Factors: {CHL AMB BH PROTECTIVE [phone]}  Goals Addressed: Patient will:  Reduce symptoms of: {IBH Symptoms:21014056}   Increase knowledge and/or ability of: {IBH Patient Tools:21014057}   Demonstrate ability to: {IBH Goals:21014053}  Progress towards Goals: {CHL AMB BH PROGRESS TOWARDS  GOALS:[phone]}    Interventions: Interventions utilized:  {IBH Interventions:21014054} Standardized Assessments completed: {IBH Screening Tools:21014051}    Patient and/or Family Response: Patient agrees with treatment plan.***   Clinical Assessment/Diagnosis  No diagnosis found.    Patient may benefit from psychoeducation and brief therapeutic interventions regarding coping with symptoms of *** .  Plan: Follow up with behavioral health clinician on : *** Behavioral recommendations: *** Referral(s): {IBH Referrals:21014055}  I discussed the assessment and treatment plan with the patient and/or parent/guardian. They were provided an opportunity to ask questions and all were answered. They agreed with the plan and demonstrated an understanding of the instructions.   They were advised to call back or seek an in-person evaluation if the symptoms worsen or if the condition fails to improve as anticipated.  [name] [name], LCSW     07/14/2023    4:55 PM 04/20/2022   10:52 AM 09/28/2021   10:47 AM 09/15/2021   10:36 AM 09/08/2021    4:51 PM  Depression screen PHQ 2/9  Decreased Interest 0 0 0 0 0  Down, Depressed, Hopeless 0 0 0 0 0  PHQ - 2 Score 0 0 0 0 0  Altered sleeping 1 1 1 1 2   Tired, decreased energy 1 1 1 1  0  Change in appetite 1 1 0 0 0  Feeling bad or failure about yourself  0 0 0 0 0  Trouble concentrating 0 0 0 0 0  Moving slowly or fidgety/restless 0 0 0 0 0  Suicidal thoughts 0 0 0 0 0  PHQ-9 Score 3  3  2  2  2    Difficult doing work/chores  Not difficult at all  Not difficult at all      Data saved with a previous flowsheet row definition      07/14/2023    4:55 PM 04/20/2022   10:52 AM 09/28/2021   10:47 AM 09/15/2021  10:36 AM  GAD 7 : Generalized Anxiety Score  Nervous, Anxious, on Edge 1 2 0 1  Control/stop worrying 1 0 0 0  Worry too much - different things 1 1 1  0  Trouble relaxing 1 1 1 1   Restless 0 0 0 0  Easily annoyed or irritable 1 2  2 2   Afraid - awful might happen 0 0 0 0  Total GAD 7 Score 5 6 4 4   Anxiety Difficulty  Not difficult at all          03/13/2024    5:09 PM 01/17/2024    8:34 PM 01/17/2024    2:30 PM 10/27/2021    9:01 AM 09/24/2021    4:26 PM  Edinburgh Postnatal Depression Scale Screening Tool  I have been able to laugh and see the funny side of things. 0 0  --  0  0   I have looked forward with enjoyment to things. 0 0   0  0   I have blamed myself unnecessarily when things went wrong. 2 0   0  0   I have been anxious or worried for no good reason. 0 0   1  0   I have felt scared or panicky for no good reason. 1 0   0  0   Things have been getting on top of me. 2 1   0  0   I have been so unhappy that I have had difficulty sleeping. 1 0   0  0   I have felt sad or miserable. 1 0   0  0   I have been so unhappy that I have been crying. 1 0   0  0   The thought of harming myself has occurred to me. 0 0   0  0   Edinburgh Postnatal Depression Scale Total 8 1  1   0      Data saved with a previous flowsheet row definition

## 2024-04-13 ENCOUNTER — Telehealth: Payer: Self-pay | Admitting: Clinical

## 2024-04-13 NOTE — Telephone Encounter (Signed)
Attempt call regarding referral; Left HIPPA-compliant message to call back Mikle Sternberg from [facility] at  [phone] (Ashelyn Mccravy's office).    

## 2024-04-20 ENCOUNTER — Telehealth: Payer: Self-pay | Admitting: Clinical

## 2024-04-20 NOTE — Telephone Encounter (Signed)
Attempt call regarding referral; Left HIPPA-compliant message to call back Mikle Sternberg from [facility] at  [phone] (Ashelyn Mccravy's office).    

## 2024-05-25 ENCOUNTER — Other Ambulatory Visit: Payer: Self-pay | Admitting: Certified Nurse Midwife

## 2024-05-25 DIAGNOSIS — F53 Postpartum depression: Secondary | ICD-10-CM
